# Patient Record
Sex: Female | Born: 1955
Health system: Southern US, Community
[De-identification: ages and names within clinical notes are randomized; demographics above are authoritative.]

## PROBLEM LIST (undated history)

## (undated) DIAGNOSIS — Z8619 Personal history of other infectious and parasitic diseases: Secondary | ICD-10-CM

## (undated) DIAGNOSIS — E039 Hypothyroidism, unspecified: Secondary | ICD-10-CM

## (undated) DIAGNOSIS — R011 Cardiac murmur, unspecified: Secondary | ICD-10-CM

## (undated) DIAGNOSIS — T7840XA Allergy, unspecified, initial encounter: Secondary | ICD-10-CM

## (undated) DIAGNOSIS — M8580 Other specified disorders of bone density and structure, unspecified site: Secondary | ICD-10-CM

## (undated) DIAGNOSIS — Z973 Presence of spectacles and contact lenses: Secondary | ICD-10-CM

## (undated) DIAGNOSIS — F429 Obsessive-compulsive disorder, unspecified: Secondary | ICD-10-CM

## (undated) DIAGNOSIS — N8110 Cystocele, unspecified: Secondary | ICD-10-CM

## (undated) DIAGNOSIS — Z972 Presence of dental prosthetic device (complete) (partial): Secondary | ICD-10-CM

## (undated) HISTORY — DX: Cardiac murmur, unspecified: R01.1

## (undated) HISTORY — DX: Other specified disorders of bone density and structure, unspecified site: M85.80

## (undated) HISTORY — DX: Personal history of other infectious and parasitic diseases: Z86.19

## (undated) HISTORY — PX: TUBAL LIGATION: SHX77

## (undated) HISTORY — DX: Allergy, unspecified, initial encounter: T78.40XA

## (undated) HISTORY — DX: Obsessive-compulsive disorder, unspecified: F42.9

## (undated) HISTORY — DX: Hypothyroidism, unspecified: E03.9

---

## 2000-05-17 HISTORY — PX: VAGINAL HYSTERECTOMY: SUR661

## 2000-05-17 HISTORY — PX: PARTIAL HYSTERECTOMY: SHX80

## 2001-03-17 HISTORY — PX: VAGINAL HYSTERECTOMY: SUR661

## 2005-05-17 LAB — HM COLONOSCOPY

## 2013-02-26 DIAGNOSIS — Z780 Asymptomatic menopausal state: Secondary | ICD-10-CM | POA: Insufficient documentation

## 2014-03-04 DIAGNOSIS — N9089 Other specified noninflammatory disorders of vulva and perineum: Secondary | ICD-10-CM | POA: Insufficient documentation

## 2016-01-23 ENCOUNTER — Ambulatory Visit (INDEPENDENT_AMBULATORY_CARE_PROVIDER_SITE_OTHER): Payer: PRIVATE HEALTH INSURANCE | Admitting: Women's Health

## 2016-01-23 ENCOUNTER — Encounter: Payer: Self-pay | Admitting: Women's Health

## 2016-01-23 VITALS — BP 130/78 | Ht 61.0 in | Wt 125.0 lb

## 2016-01-23 DIAGNOSIS — E038 Other specified hypothyroidism: Secondary | ICD-10-CM | POA: Diagnosis not present

## 2016-01-23 DIAGNOSIS — Z01419 Encounter for gynecological examination (general) (routine) without abnormal findings: Secondary | ICD-10-CM

## 2016-01-23 DIAGNOSIS — E039 Hypothyroidism, unspecified: Secondary | ICD-10-CM | POA: Insufficient documentation

## 2016-01-23 DIAGNOSIS — Z79890 Hormone replacement therapy (postmenopausal): Secondary | ICD-10-CM | POA: Diagnosis not present

## 2016-01-23 LAB — TSH: TSH: 0.54 mIU/L

## 2016-01-23 MED ORDER — THYROID 113.75 MG PO TABS: ORAL_TABLET | ORAL | 4 refills | Status: DC

## 2016-01-23 MED ORDER — PROGESTERONE MICRONIZED 100 MG PO CAPS: 300.0000 mg | ORAL_CAPSULE | Freq: Every day | ORAL | 4 refills | Status: DC

## 2016-01-23 MED ORDER — ESTRADIOL 0.1 MG/24HR TD PTTW: MEDICATED_PATCH | TRANSDERMAL | 4 refills | Status: DC

## 2016-01-23 NOTE — Progress Notes (Signed)
Christina Russo 01/07/1956 [id number]    History:    Presents for new patient annual exam. Postmenopausal on HRT, hysterectomy 2002 per patient. Reports normal Pap and mammogram history, last Pap 4-5 years ago, [facility].  [facility]. Up to date on colonoscopy screening. Endorses taking Prometrium and Estradiol, which has greatly improved menopausal symptoms. Takes Vit. D and Calcium supplement. Last sexually active in 2014, denies need for STD screen. Walks, does yoga, and some weight bearing exercise. Healthy diet. Reports normal CBC, lipid panel, blood sugar January 2017 at primary care.  Past medical history, past surgical history, family history and social history were all reviewed and documented in the EPIC chart. [place]. 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. 3 children, 8 grandchildren, all doing well.  ROS:  A ROS was performed and pertinent positives and negatives are included.  Exam:  Vitals:   01/23/16 0834  BP: 130/78  Weight: 125 lb (56.7 kg)  Height: 5\' 1"  (1.549 m)   Body mass index is 23.62 kg/m.   General appearance:  Normal Thyroid:  Symmetrical, normal in size, without palpable masses or nodularity. Respiratory  Auscultation:  Clear without wheezing or rhonchi Cardiovascular  Auscultation:  Regular rate, without rubs, murmurs or gallops  Edema/varicosities:  Not grossly evident Abdominal  Soft,nontender, without masses, guarding or rebound.  Liver/spleen:  No organomegaly noted  Hernia:  None appreciated  Skin  Inspection:  Grossly normal   Breasts: Examined lying and sitting.     Right: Without masses, retractions, discharge or axillary adenopathy.     Left: Without masses, retractions, discharge or axillary adenopathy. Gentitourinary   Inguinal/mons:  Normal without inguinal adenopathy  External genitalia:  Normal  BUS/Urethra/Skene's glands:  Normal  Vagina:   Normal. Mildly atrophic.  Cervix:  Absent  Uterus:  Absent  Adnexa/parametria:     Rt: Without masses or tenderness.   Lt: Without masses or tenderness.  Anus and perineum: Normal  Digital rectal exam: Normal sphincter tone without palpated masses or tenderness  Assessment/Plan:  60 y.o.  DWF G3P3 for annual exam without complaints.  Postmenopausal/Hysterectomy 2002 -- on HRT Hypothyroid    Plan: HRT, reviewed risks of blood clots, strokes and breast cancer requests to continue  Natural Prometrium 300 mg and Vivelle patch 0.1 mg half patch 3 times weekly prescriptions. Reviewed progesterone is to protect the uterus which she does not have desires to continue [employer] Nature Armor thyroid 113.75 mg PO daily, #90.  Encouraged to receive Zostavac . Encouraged to continue Calcium and Vit. D supplement. Discussed using condoms if becoming sexually active. Discussed SBEs, annual annual screening mammogram have results sent to our office, healthy diet, exercise, and fall prevention. Encouraged to continue leisure activities with friends and family. UA, TSHHarrington Challenger.    Hodge Stachnik J [facility], 9:28 AM 01/23/2016

## 2016-01-23 NOTE — Patient Instructions (Signed)

## 2016-01-24 LAB — URINALYSIS W MICROSCOPIC + REFLEX CULTURE
BACTERIA UA: NONE SEEN [HPF]
Bilirubin Urine: NEGATIVE
CASTS: NONE SEEN [LPF]
CRYSTALS: NONE SEEN [HPF]
GLUCOSE, UA: NEGATIVE
HGB URINE DIPSTICK: NEGATIVE
Ketones, ur: NEGATIVE
Leukocytes, UA: NEGATIVE
Nitrite: NEGATIVE
PH: 6 (ref 5.0–8.0)
Protein, ur: NEGATIVE
RBC / HPF: NONE SEEN RBC/HPF (ref <=2)
Specific Gravity, Urine: 1.022 (ref 1.001–1.035)
Squamous Epithelial / LPF: NONE SEEN [HPF] (ref <=5)
WBC, UA: NONE SEEN WBC/HPF (ref <=5)
YEAST: NONE SEEN [HPF]

## 2016-02-25 ENCOUNTER — Telehealth: Payer: Self-pay

## 2016-02-25 DIAGNOSIS — E038 Other specified hypothyroidism: Secondary | ICD-10-CM

## 2016-02-25 MED ORDER — THYROID 113.75 MG PO TABS: ORAL_TABLET | ORAL | 4 refills | Status: DC

## 2016-02-25 NOTE — Telephone Encounter (Signed)
Patient called him and is requesting a new fill there at [facility] her [facility] Thyroid. [facility]  Rx sent.

## 2016-03-30 ENCOUNTER — Telehealth: Payer: Self-pay

## 2016-03-30 DIAGNOSIS — E038 Other specified hypothyroidism: Secondary | ICD-10-CM

## 2016-03-30 MED ORDER — THYROID 60 MG PO TABS: 60.0000 mg | ORAL_TABLET | Freq: Every day | ORAL | 0 refills | Status: DC

## 2016-03-30 NOTE — Telephone Encounter (Signed)
Fax received stating Patient has a script for Merrill Lynchature Throid tab and currently this medication is long term out of stock and not available from the manufacturer. All Strengths of Westhroid, Nature-Throid and WP Throid products are currently out of stock. Pharmacy asked [place] to prescribed a new med. Said Armour Thyroid is available in limited quantities at this time.  [place] recommended Armour Thyroid 60 mg daily and recheck TSH in 6 weeks. I called patient and explained all of this in her voice mail. I asked her to call me back and let me know that she received my message and if any questions.

## 2016-03-30 NOTE — Telephone Encounter (Signed)
Patient called back and spoke with Debarah Crapelaudia and said she received my message and understood. Claudia scheduled her a lab appt in 6 weeks to recheck TSH. Order placed.

## 2016-05-05 ENCOUNTER — Other Ambulatory Visit: Payer: Self-pay

## 2016-05-05 MED ORDER — THYROID 60 MG PO TABS: 60.0000 mg | ORAL_TABLET | Freq: Every day | ORAL | 2 refills | Status: DC

## 2016-05-05 MED ORDER — THYROID 60 MG PO TABS: 60.0000 mg | ORAL_TABLET | Freq: Every day | ORAL | 0 refills | Status: DC

## 2016-05-17 HISTORY — PX: COLONOSCOPY: SHX174

## 2016-05-21 ENCOUNTER — Other Ambulatory Visit: Payer: PRIVATE HEALTH INSURANCE

## 2016-06-18 ENCOUNTER — Other Ambulatory Visit: Payer: PRIVATE HEALTH INSURANCE

## 2016-07-09 ENCOUNTER — Other Ambulatory Visit: Payer: PRIVATE HEALTH INSURANCE

## 2016-07-09 DIAGNOSIS — E038 Other specified hypothyroidism: Secondary | ICD-10-CM

## 2016-07-09 LAB — TSH: TSH: 1.66 m[IU]/L

## 2016-07-12 ENCOUNTER — Other Ambulatory Visit: Payer: Self-pay | Admitting: Women's Health

## 2016-07-12 MED ORDER — THYROID 60 MG PO TABS: 60.0000 mg | ORAL_TABLET | Freq: Every day | ORAL | 3 refills | Status: DC

## 2016-09-29 ENCOUNTER — Encounter: Payer: Self-pay | Admitting: Gynecology

## 2016-10-15 LAB — HM MAMMOGRAPHY: HM Mammogram: NORMAL (ref 0–4)

## 2017-01-28 ENCOUNTER — Encounter: Payer: PRIVATE HEALTH INSURANCE | Admitting: Women's Health

## 2017-01-31 ENCOUNTER — Ambulatory Visit (INDEPENDENT_AMBULATORY_CARE_PROVIDER_SITE_OTHER): Payer: PRIVATE HEALTH INSURANCE | Admitting: Women's Health

## 2017-01-31 ENCOUNTER — Encounter: Payer: Self-pay | Admitting: Women's Health

## 2017-01-31 VITALS — BP 124/78 | Ht 61.5 in | Wt 126.0 lb

## 2017-01-31 DIAGNOSIS — E038 Other specified hypothyroidism: Secondary | ICD-10-CM

## 2017-01-31 DIAGNOSIS — E559 Vitamin D deficiency, unspecified: Secondary | ICD-10-CM

## 2017-01-31 DIAGNOSIS — Z01419 Encounter for gynecological examination (general) (routine) without abnormal findings: Secondary | ICD-10-CM | POA: Diagnosis not present

## 2017-01-31 DIAGNOSIS — Z79890 Hormone replacement therapy (postmenopausal): Secondary | ICD-10-CM

## 2017-01-31 DIAGNOSIS — Z13220 Encounter for screening for lipoid disorders: Secondary | ICD-10-CM

## 2017-01-31 LAB — CBC WITH DIFFERENTIAL/PLATELET
BASOS ABS: 38 {cells}/uL (ref 0–200)
Basophils Relative: 0.6 %
EOS ABS: 166 {cells}/uL (ref 15–500)
Eosinophils Relative: 2.6 %
HEMATOCRIT: 40.2 % (ref 35.0–45.0)
Hemoglobin: 13.6 g/dL (ref 11.7–15.5)
LYMPHS ABS: 1734 {cells}/uL (ref 850–3900)
MCH: 32.1 pg (ref 27.0–33.0)
MCHC: 33.8 g/dL (ref 32.0–36.0)
MCV: 94.8 fL (ref 80.0–100.0)
MPV: 12.4 fL (ref 7.5–12.5)
Monocytes Relative: 9.6 %
Neutro Abs: 3846 cells/uL (ref 1500–7800)
Neutrophils Relative %: 60.1 %
Platelets: 248 10*3/uL (ref 140–400)
RBC: 4.24 10*6/uL (ref 3.80–5.10)
RDW: 12.3 % (ref 11.0–15.0)
TOTAL LYMPHOCYTE: 27.1 %
WBC: 6.4 10*3/uL (ref 3.8–10.8)
WBCMIX: 614 {cells}/uL (ref 200–950)

## 2017-01-31 LAB — COMPREHENSIVE METABOLIC PANEL
AG RATIO: 1.7 (calc) (ref 1.0–2.5)
ALBUMIN MSPROF: 4.3 g/dL (ref 3.6–5.1)
ALT: 13 U/L (ref 6–29)
AST: 15 U/L (ref 10–35)
Alkaline phosphatase (APISO): 61 U/L (ref 33–130)
BILIRUBIN TOTAL: 0.4 mg/dL (ref 0.2–1.2)
BUN: 15 mg/dL (ref 7–25)
CO2: 27 mmol/L (ref 20–32)
Calcium: 9 mg/dL (ref 8.6–10.4)
Chloride: 102 mmol/L (ref 98–110)
Creat: 0.67 mg/dL (ref 0.50–0.99)
GLUCOSE: 97 mg/dL (ref 65–99)
Globulin: 2.5 g/dL (calc) (ref 1.9–3.7)
POTASSIUM: 4.1 mmol/L (ref 3.5–5.3)
SODIUM: 137 mmol/L (ref 135–146)
TOTAL PROTEIN: 6.8 g/dL (ref 6.1–8.1)

## 2017-01-31 LAB — LIPID PANEL
Cholesterol: 178 mg/dL (ref <200)
HDL: 97 mg/dL (ref >50)
LDL CHOLESTEROL (CALC): 65 mg/dL
NON-HDL CHOLESTEROL (CALC): 81 mg/dL (ref <130)
TRIGLYCERIDES: 78 mg/dL (ref <150)
Total CHOL/HDL Ratio: 1.8 (calc) (ref <5.0)

## 2017-01-31 LAB — TSH: TSH: 1.08 mIU/L (ref 0.40–4.50)

## 2017-01-31 MED ORDER — PROGESTERONE MICRONIZED 100 MG PO CAPS: 300.0000 mg | ORAL_CAPSULE | Freq: Every day | ORAL | 4 refills | Status: DC

## 2017-01-31 MED ORDER — LEVOTHYROXINE SODIUM 88 MCG PO TABS: 88.0000 ug | ORAL_TABLET | Freq: Every day | ORAL | 90 refills | Status: DC

## 2017-01-31 MED ORDER — ESTRADIOL 0.1 MG/24HR TD PTTW: MEDICATED_PATCH | TRANSDERMAL | 4 refills | Status: DC

## 2017-01-31 NOTE — Patient Instructions (Addendum)
Dr Celine Ahr [facility]  [phone]  [facility]   Health Maintenance for Postmenopausal Women Menopause is a normal process in which your reproductive ability comes to an end. This process happens gradually over a span of months to years, usually between the ages of 19 and 3. Menopause is complete when you have missed 12 consecutive menstrual periods. It is important to talk with your health care provider about some of the most common conditions that affect postmenopausal women, such as heart disease, cancer, and bone loss (osteoporosis). Adopting a healthy lifestyle and getting preventive care can help to promote your health and wellness. Those actions can also lower your chances of developing some of these common conditions. What should I know about menopause? During menopause, you may experience a number of symptoms, such as:  Moderate-to-severe hot flashes.  Night sweats.  Decrease in sex drive.  Mood swings.  Headaches.  Tiredness.  Irritability.  Memory problems.  Insomnia.  Choosing to treat or not to treat menopausal changes is an individual decision that you make with your health care provider. What should I know about hormone replacement therapy and supplements? Hormone therapy products are effective for treating symptoms that are associated with menopause, such as hot flashes and night sweats. Hormone replacement carries certain risks, especially as you become older. If you are thinking about using estrogen or estrogen with progestin treatments, discuss the benefits and risks with your health care provider. What should I know about heart disease and stroke? Heart disease, heart attack, and stroke become more likely as you age. This may be due, in part, to the hormonal changes that your body experiences during menopause. These can affect how your body processes dietary fats, triglycerides, and cholesterol. Heart attack and stroke are both medical  emergencies. There are many things that you can do to help prevent heart disease and stroke:  Have your blood pressure checked at least every 1-2 years. High blood pressure causes heart disease and increases the risk of stroke.  If you are 60-79 years old, ask your health care provider if you should take aspirin to prevent a heart attack or a stroke.  Do not use any tobacco products, including cigarettes, chewing tobacco, or electronic cigarettes. If you need help quitting, ask your health care provider.  It is important to eat a healthy diet and maintain a healthy weight. ? Be sure to include plenty of vegetables, fruits, low-fat dairy products, and lean protein. ? Avoid eating foods that are high in solid fats, added sugars, or salt (sodium).  Get regular exercise. This is one of the most important things that you can do for your health. ? Try to exercise for at least 150 minutes each week. The type of exercise that you do should increase your heart rate and make you sweat. This is known as moderate-intensity exercise. ? Try to do strengthening exercises at least twice each week. Do these in addition to the moderate-intensity exercise.  Know your numbers.Ask your health care provider to check your cholesterol and your blood glucose. Continue to have your blood tested as directed by your health care provider.  What should I know about cancer screening? There are several types of cancer. Take the following steps to reduce your risk and to catch any cancer development as early as possible. Breast Cancer  Practice breast self-awareness. ? This means understanding how your breasts normally appear and feel. ? It also means doing regular breast self-exams. Let your health care  provider know about any changes, no matter how small.  If you are 12 or older, have a clinician do a breast exam (clinical breast exam or CBE) every year. Depending on your age, family history, and medical history, it  may be recommended that you also have a yearly breast X-ray (mammogram).  If you have a family history of breast cancer, talk with your health care provider about genetic screening.  If you are at high risk for breast cancer, talk with your health care provider about having an MRI and a mammogram every year.  Breast cancer (BRCA) gene test is recommended for women who have family members with BRCA-related cancers. Results of the assessment will determine the need for genetic counseling and BRCA1 and for BRCA2 testing. BRCA-related cancers include these types: ? Breast. This occurs in males or females. ? Ovarian. ? Tubal. This may also be called fallopian tube cancer. ? Cancer of the abdominal or pelvic lining (peritoneal cancer). ? Prostate. ? Pancreatic.  Cervical, Uterine, and Ovarian Cancer Your health care provider may recommend that you be screened regularly for cancer of the pelvic organs. These include your ovaries, uterus, and vagina. This screening involves a pelvic exam, which includes checking for microscopic changes to the surface of your cervix (Pap test).  For women ages 21-65, health care providers may recommend a pelvic exam and a Pap test every three years. For women ages 8-65, they may recommend the Pap test and pelvic exam, combined with testing for human papilloma virus (HPV), every five years. Some types of HPV increase your risk of cervical cancer. Testing for HPV may also be done on women of any age who have unclear Pap test results.  Other health care providers may not recommend any screening for nonpregnant women who are considered low risk for pelvic cancer and have no symptoms. Ask your health care provider if a screening pelvic exam is right for you.  If you have had past treatment for cervical cancer or a condition that could lead to cancer, you need Pap tests and screening for cancer for at least 20 years after your treatment. If Pap tests have been discontinued  for you, your risk factors (such as having a new sexual partner) need to be reassessed to determine if you should start having screenings again. Some women have medical problems that increase the chance of getting cervical cancer. In these cases, your health care provider may recommend that you have screening and Pap tests more often.  If you have a family history of uterine cancer or ovarian cancer, talk with your health care provider about genetic screening.  If you have vaginal bleeding after reaching menopause, tell your health care provider.  There are currently no reliable tests available to screen for ovarian cancer.  Lung Cancer Lung cancer screening is recommended for adults 76-42 years old who are at high risk for lung cancer because of a history of smoking. A yearly low-dose CT scan of the lungs is recommended if you:  Currently smoke.  Have a history of at least 30 pack-years of smoking and you currently smoke or have quit within the past 15 years. A pack-year is smoking an average of one pack of cigarettes per day for one year.  Yearly screening should:  Continue until it has been 15 years since you quit.  Stop if you develop a health problem that would prevent you from having lung cancer treatment.  Colorectal Cancer  This type of cancer can be  detected and can often be prevented.  Routine colorectal cancer screening usually begins at age 31 and continues through age 32.  If you have risk factors for colon cancer, your health care provider may recommend that you be screened at an earlier age.  If you have a family history of colorectal cancer, talk with your health care provider about genetic screening.  Your health care provider may also recommend using home test kits to check for hidden blood in your stool.  A small camera at the end of a tube can be used to examine your colon directly (sigmoidoscopy or colonoscopy). This is done to check for the earliest forms of  colorectal cancer.  Direct examination of the colon should be repeated every 5-10 years until age 13. However, if early forms of precancerous polyps or small growths are found or if you have a family history or genetic risk for colorectal cancer, you may need to be screened more often.  Skin Cancer  Check your skin from head to toe regularly.  Monitor any moles. Be sure to tell your health care provider: ? About any new moles or changes in moles, especially if there is a change in a mole's shape or color. ? If you have a mole that is larger than the size of a pencil eraser.  If any of your family members has a history of skin cancer, especially at a [place] age, talk with your health care provider about genetic screening.  Always use sunscreen. Apply sunscreen liberally and repeatedly throughout the day.  Whenever you are outside, protect yourself by wearing long sleeves, pants, a wide-brimmed hat, and sunglasses.  What should I know about osteoporosis? Osteoporosis is a condition in which bone destruction happens more quickly than new bone creation. After menopause, you may be at an increased risk for osteoporosis. To help prevent osteoporosis or the bone fractures that can happen because of osteoporosis, the following is recommended:  If you are 48-24 years old, get at least 1,000 mg of calcium and at least 600 mg of vitamin D per day.  If you are older than age 43 but younger than age 71, get at least 1,200 mg of calcium and at least 600 mg of vitamin D per day.  If you are older than age 51, get at least 1,200 mg of calcium and at least 800 mg of vitamin D per day.  Smoking and excessive alcohol intake increase the risk of osteoporosis. Eat foods that are rich in calcium and vitamin D, and do weight-bearing exercises several times each week as directed by your health care provider. What should I know about how menopause affects my mental health? Depression may occur at any age, but it  is more common as you become older. Common symptoms of depression include:  Low or sad mood.  Changes in sleep patterns.  Changes in appetite or eating patterns.  Feeling an overall lack of motivation or enjoyment of activities that you previously enjoyed.  Frequent crying spells.  Talk with your health care provider if you think that you are experiencing depression. What should I know about immunizations? It is important that you get and maintain your immunizations. These include:  Tetanus, diphtheria, and pertussis (Tdap) booster vaccine.  Influenza every year before the flu season begins.  Pneumonia vaccine.  Shingles vaccine.  Your health care provider may also recommend other immunizations. This information is not intended to replace advice given to you by your health care provider. Make sure  you discuss any questions you have with your health care provider. Document Released: 06/25/2005 Document Revised: 11/21/2015 Document Reviewed: 02/04/2015 Elsevier Interactive Patient Education  2018 [employer].

## 2017-01-31 NOTE — Progress Notes (Addendum)
Christina Russo 02-05-1956 [id number]    History:    Presents for annual exam.  2002 TVH for fibroids on HRT was started on progesterone and estrogen per primary care. Continues to have hot flushes. 2017 T score -2.6 declines medication. Negative colonoscopy greater than 10 years ago. Healthy lifestyle of yoga, weight 7 walking. Not sexually active in years. Normal Pap and mammogram history. Hypothyroid on Armour thyroid, complained of cost.  Past medical history, past surgical history, family history and social history were all reviewed and documented in the EPIC chart. 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  ROS:  A ROS was performed and pertinent positives and negatives are included.  Exam:  Vitals:   01/31/17 0800  BP: 124/78  Weight: 126 lb (57.2 kg)  Height: 5' 1.5" (1.562 m)   Body mass index is 23.42 kg/m.   General appearance:  Normal Thyroid:  Symmetrical, normal in size, without palpable masses or nodularity. Respiratory  Auscultation:  Clear without wheezing or rhonchi Cardiovascular  Auscultation:  Regular rate, without rubs, murmurs or gallops  Edema/varicosities:  Not grossly evident Abdominal  Soft,nontender, without masses, guarding or rebound.  Liver/spleen:  No organomegaly noted  Hernia:  None appreciated  Skin  Inspection:  Grossly normal   Breasts: Examined lying and sitting.     Right: Without masses, retractions, discharge or axillary adenopathy.     Left: Without masses, retractions, discharge or axillary adenopathy. Gentitourinary   Inguinal/mons:  Normal without inguinal adenopathy  External genitalia:  Normal  BUS/Urethra/Skene's glands:  Normal  Vagina:  Normal  Cervix:  Absent  Uterus: Absent  Adnexa/parametria:     Rt: Without masses or tenderness.   Lt: Without masses or tenderness.  Anus and perineum: Normal  Digital rectal exam: Normal sphincter tone without palpated masses or tenderness  Assessment/Plan:  61 y.o. D WF G3 P3  for annual exam with continued hot flushes on HRT.  02 TVH on HRT Hypothyroid Osteoporosis declines medication  Plan: Currently on Armour thyroid will try Synthroid 88 g, recheck TSH in 6 weeks. Proper administration reviewed. The Vivelle patch 0.1 mg twice weekly, Prometrium 100 mg daily, prescriptions for both given with review of risks of blood clots, strokes and breast cancer. Reviewed Prometrium is not needed, hysterectomy. States has done well on both would like to continue. SBE's, continue annual screening mammogram, calcium rich diet, vitamin D 2000 daily encouraged. Continue healthy lifestyle of yoga, walking and weight training. Overdue for screening Colonoscopy, [facility] information given instructed to schedule. Osteoporosis treatment reviewed, declines. CBC, CMP, lipid panel, TSH, vitamin D.     Harrington Challenger [facility], 8:41 AM 01/31/2017

## 2017-02-01 LAB — VITAMIN D 25 HYDROXY (VIT D DEFICIENCY, FRACTURES): Vit D, 25-Hydroxy: 30 ng/mL (ref 30–100)

## 2017-02-04 ENCOUNTER — Encounter: Payer: Self-pay | Admitting: Internal Medicine

## 2017-03-09 ENCOUNTER — Encounter: Payer: Self-pay | Admitting: Family

## 2017-03-09 ENCOUNTER — Ambulatory Visit (INDEPENDENT_AMBULATORY_CARE_PROVIDER_SITE_OTHER): Payer: No Typology Code available for payment source | Admitting: Family

## 2017-03-09 VITALS — BP 131/73 | HR 67 | Temp 98.1°F | Resp 16 | Ht 61.5 in | Wt 127.0 lb

## 2017-03-09 DIAGNOSIS — Z0000 Encounter for general adult medical examination without abnormal findings: Secondary | ICD-10-CM | POA: Diagnosis not present

## 2017-03-09 DIAGNOSIS — F429 Obsessive-compulsive disorder, unspecified: Secondary | ICD-10-CM

## 2017-03-09 HISTORY — DX: Obsessive-compulsive disorder, unspecified: F42.9

## 2017-03-09 MED ORDER — NONFORMULARY OR COMPOUNDED ITEM: 1.0000 | Freq: Every day | 3 refills | Status: DC

## 2017-03-09 NOTE — Patient Instructions (Signed)
Continue healthy diet and regular exercise.   Welcome to [place]! 

## 2017-03-09 NOTE — Progress Notes (Signed)
Subjective:    Patient ID: Christina Russo, female    DOB: 19-Jun-1955, 61 y.o.   MRN: [id number]  HPI  Christina Russo is a 61 yr old female who presents today to establish care.   pmhx is significant for hypothyroid.    Patient presents today for complete physical.  Immunizations: tetanus 2014, flu shot 03/04/17 Diet: healthy Exercise: yoga, cardio Colonoscopy:  Scheduled, last col in 2006 Dexa:  2016 Pap Smear: hysterectomy Mammogram: 10/2016 Dental: up to date Vision: contacts, exam up to date  OCD- reports that this is well controlled on the following:  Methylcobalamin 1mg  and methylfolate10mg  med solution 1 cap daily   Review of Systems  Constitutional: Negative for unexpected weight change.  HENT: Negative for hearing loss and rhinorrhea.   Eyes: Negative for visual disturbance.  Respiratory: Negative for cough and shortness of breath.   Cardiovascular: Negative for leg swelling.  Gastrointestinal: Negative for blood in stool, constipation and diarrhea.  Genitourinary: Negative for dysuria, frequency and hematuria.  Musculoskeletal: Negative for arthralgias and myalgias.  Skin:       Mild eczema on her leg, worse in the winter  Hematological: Negative for adenopathy.  Psychiatric/Behavioral:       Denies depression/anxiety   Past Medical History:  Diagnosis Date  . Heart murmur    "slight / intermittent"  . History of chicken pox   . Hypothyroidism      Social History   Social History  . Marital status: Divorced    Spouse name: N/A  . Number of children: N/A  . Years of education: N/A   Occupational History  . Not on file.   Social History Main Topics  . Smoking status: Former Games developer  . Smokeless tobacco: Never Used  . Alcohol use 4.2 oz/week    7 Glasses of wine per week  . Drug use: No  . Sexual activity: Not Currently     Comment: INTERCOURSE AGE 84, SEXUAL PARTNERS LESS THAN 5   Other Topics Concern  . Not on file   Social History Narrative    . No narrative on file    Past Surgical History:  Procedure Laterality Date  . PARTIAL HYSTERECTOMY  2002    Family History  Problem Relation Age of Onset  . Diabetes Mother   . Hypertension Mother   . Diabetes Father   . Congestive Heart Failure Father   . Hypertension Father   . Heart attack Father   . Glaucoma Sister   . Blindness Sister   . Heart attack Brother   . Stroke Maternal Grandmother   . Blindness Sister   . Blindness Brother   . Heart disease Brother   . Drug abuse Son   . Blindness Brother     Allergies  Allergen Reactions  . Codeine Nausea And Vomiting  . Penicillins     Current Outpatient Prescriptions on File Prior to Visit  Medication Sig Dispense Refill  . CALCIUM-VITAMIN D PO Take 1 tablet by mouth daily.    . Cholecalciferol (VITAMIN D3) 1000 units CAPS Take by mouth.    Tery Sanfilippo Calcium (STOOL SOFTENER PO) Take by mouth 3 times/day as needed-between meals & bedtime.    Marland Kitchen estradiol (VIVELLE-DOT) 0.1 MG/24HR patch 1/2 PATCH 3 TIMES WEEKLY 24 patch 4  . levothyroxine (SYNTHROID) 88 MCG tablet Take 1 tablet (88 mcg total) by mouth daily before breakfast. 90 tablet 90  . Melatonin 3 MG TABS Take 2 tablets by mouth daily.    Marland Kitchen  METHYLCOBALAMIN PO Take by mouth. 1 MG DAILY    . NONFORMULARY OR COMPOUNDED ITEM VALERIAN SLEEP MED PRN    . NONFORMULARY OR COMPOUNDED ITEM CASCARA SAGRADA 1/2 TAB PO QD    . progesterone (PROMETRIUM) 100 MG capsule Take 3 capsules (300 mg total) by mouth daily. 270 capsule 4   No current facility-administered medications on file prior to visit.     BP 131/73 (BP Location: Right Arm, Cuff Size: Normal)   Pulse 67   Temp 98.1 F (36.7 C) (Oral)   Resp 16   Ht 5' 1.5" (1.562 m)   Wt 127 lb (57.6 kg)   SpO2 100%   BMI 23.61 kg/m       Objective:   Physical Exam  Physical Exam  Constitutional: She is oriented to person, place, and time. She appears well-developed and well-nourished. No distress.  HENT:   Head: Normocephalic and atraumatic.  Right Ear: Tympanic membrane and ear canal normal.  Left Ear: Tympanic membrane and ear canal normal.  Mouth/Throat: Oropharynx is clear and moist.  Eyes: Pupils are equal, round, and reactive to light. No scleral icterus.  Neck: Normal range of motion. No thyromegaly present.  Cardiovascular: Normal rate and regular rhythm.   No murmur heard. Pulmonary/Chest: Effort normal and breath sounds normal. No respiratory distress. He has no wheezes. She has no rales. She exhibits no tenderness.  Abdominal: Soft. Bowel sounds are normal. She exhibits no distension and no mass. There is no tenderness. There is no rebound and no guarding.  Musculoskeletal: She exhibits no edema.  Lymphadenopathy:    She has no cervical adenopathy.  Neurological: She is alert and oriented to person, place, and time. She has normal patellar reflexes. She exhibits normal muscle tone. Coordination normal.  Skin: Skin is warm and dry.  Psychiatric: She has a normal mood and affect. Her behavior is normal. Judgment and thought content normal.  Breasts: deferred Pelvic: deferred          Assessment & Plan:         Assessment & Plan:  Preventative care- encouraged pt to continue healthy diet, regular exercise. She has rx from her GYN for the shingrix rx.  Tetanus/flu up to date. mammo  Up to date, colo is due and is scheduled. EKG tracing is personally reviewed.  EKG notes NSR.  No acute changes.

## 2017-04-01 ENCOUNTER — Ambulatory Visit (AMBULATORY_SURGERY_CENTER): Payer: Self-pay

## 2017-04-01 ENCOUNTER — Other Ambulatory Visit: Payer: Self-pay

## 2017-04-01 VITALS — Ht 61.0 in | Wt 126.6 lb

## 2017-04-01 DIAGNOSIS — Z1211 Encounter for screening for malignant neoplasm of colon: Secondary | ICD-10-CM

## 2017-04-01 NOTE — Progress Notes (Signed)
Denies allergies to eggs or soy products. Denies complication of anesthesia or sedation. Denies use of weight loss medication. Denies use of O2.   Emmi instructions declined.  

## 2017-04-15 ENCOUNTER — Ambulatory Visit (AMBULATORY_SURGERY_CENTER): Payer: No Typology Code available for payment source | Admitting: Internal Medicine

## 2017-04-15 ENCOUNTER — Other Ambulatory Visit: Payer: Self-pay

## 2017-04-15 ENCOUNTER — Encounter: Payer: Self-pay | Admitting: Internal Medicine

## 2017-04-15 VITALS — BP 133/72 | HR 68 | Temp 98.4°F | Resp 17 | Ht 61.0 in | Wt 126.0 lb

## 2017-04-15 DIAGNOSIS — Z1212 Encounter for screening for malignant neoplasm of rectum: Secondary | ICD-10-CM

## 2017-04-15 DIAGNOSIS — Z1211 Encounter for screening for malignant neoplasm of colon: Secondary | ICD-10-CM | POA: Diagnosis not present

## 2017-04-15 MED ORDER — SODIUM CHLORIDE 0.9 % IV SOLN: 500.0000 mL | INTRAVENOUS | Status: DC

## 2017-04-15 NOTE — Patient Instructions (Addendum)
No polyps or cancer seen. You do have a change in the colon lining called melanosis - usually from senna in laxatives or herbal products.  Next routine colonoscopy or other screening test in 10 years - 2028  I appreciate the opportunity to care for you. Christina Booparl E. Gessner, MD, FACG  YOU HAD AN ENDOSCOPIC PROCEDURE TODAY AT THE [facility]:   Refer to the procedure report that was given to you for any specific questions about what was found during the examination.  If the procedure report does not answer your questions, please call your gastroenterologist to clarify.  If you requested that your care partner not be given the details of your procedure findings, then the procedure report has been included in a sealed envelope for you to review at your convenience later.  YOU SHOULD EXPECT: Some feelings of bloating in the abdomen. Passage of more gas than usual.  Walking can help get rid of the air that was put into your GI tract during the procedure and reduce the bloating. If you had a lower endoscopy (such as a colonoscopy or flexible sigmoidoscopy) you may notice spotting of blood in your stool or on the toilet paper. If you underwent a bowel prep for your procedure, you may not have a normal bowel movement for a few days.  Please Note:  You might notice some irritation and congestion in your nose or some drainage.  This is from the oxygen used during your procedure.  There is no need for concern and it should clear up in a day or so.  SYMPTOMS TO REPORT IMMEDIATELY:   Following lower endoscopy (colonoscopy or flexible sigmoidoscopy):  Excessive amounts of blood in the stool  Significant tenderness or worsening of abdominal pains  Swelling of the abdomen that is new, acute  Fever of 100F or higher   Following upper endoscopy (EGD)  Vomiting of blood or coffee ground material  New chest pain or pain under the shoulder blades  Painful or persistently difficult  swallowing  New shortness of breath  Fever of 100F or higher  Black, tarry-looking stools  For urgent or emergent issues, a gastroenterologist can be reached at any hour by calling [phone].   DIET:  We do recommend a small meal at first, but then you may proceed to your regular diet.  Drink plenty of fluids but you should avoid alcoholic beverages for 24 hours.  ACTIVITY:  You should plan to take it easy for the rest of today and you should NOT DRIVE or use heavy machinery until tomorrow (because of the sedation medicines used during the test).    FOLLOW UP: Our staff will call the number listed on your records the next business day following your procedure to check on you and address any questions or concerns that you may have regarding the information given to you following your procedure. If we do not reach you, we will leave a message.  However, if you are feeling well and you are not experiencing any problems, there is no need to return our call.  We will assume that you have returned to your regular daily activities without incident.  If any biopsies were taken you will be contacted by phone or by letter within the next 1-3 weeks.  Please call us at [phone] if you have not heard about the biopsies in 3 weeks.    SIGNATURES/CONFIDENTIALITY: You and/or your care partner have signed paperwork which will be entered into your  electronic medical record.  These signatures attest to the fact that that the information above on your After Visit Summary has been reviewed and is understood.  Full responsibility of the confidentiality of this discharge information lies with you and/or your care-partner.

## 2017-04-15 NOTE — Progress Notes (Signed)
To recovery, report to RN, VSS. 

## 2017-04-15 NOTE — Progress Notes (Signed)
Pt's states no medical or surgical changes since previsit or office visit. 

## 2017-04-15 NOTE — Op Note (Signed)
[facility] Patient Name: Christina BurrowJudy Russo Procedure Date: 04/15/2017 7:51 AM MRN: [id number] Endoscopist: Iva Booparl E Jubal Rademaker , MD Age: [age] Referring MD:  Date of Birth: 07/10/1955 Gender: Female Account #: [id number] Procedure:                Colonoscopy Indications:              Screening for colorectal malignant neoplasm Medicines:                Propofol per Anesthesia, Monitored Anesthesia Care Procedure:                Pre-Anesthesia Assessment:                           - Prior to the procedure, a History and Physical                            was performed, and patient medications and                            allergies were reviewed. The patient's tolerance of                            previous anesthesia was also reviewed. The risks                            and benefits of the procedure and the sedation                            options and risks were discussed with the patient.                            All questions were answered, and informed consent                            was obtained. Prior Anticoagulants: The patient has                            taken no previous anticoagulant or antiplatelet                            agents. ASA Grade Assessment: II - A patient with                            mild systemic disease. After reviewing the risks                            and benefits, the patient was deemed in                            satisfactory condition to undergo the procedure.                           After obtaining informed consent, the colonoscope  was passed under direct vision. Throughout the                            procedure, the patient's blood pressure, pulse, and                            oxygen saturations were monitored continuously. The                            Model PCF-[id number] scope was introduced                            through the anus and advanced to the the terminal           ileum, with identification of the appendiceal                            orifice and IC valve. The colonoscopy was performed                            without difficulty. The patient tolerated the                            procedure well. The quality of the bowel                            preparation was excellent. The ileocecal valve,                            appendiceal orifice, and rectum were photographed.                            The bowel preparation used was Miralax. Scope In: 8:00:36 AM Scope Out: 8:12:40 AM Scope Withdrawal Time: 0 hours 8 minutes 56 seconds  Total Procedure Duration: 0 hours 12 minutes 4 seconds  Findings:                 The perianal and digital rectal examinations were                            normal.                           A diffuse area of severe melanosis was found in the                            entire colon.                           The exam was otherwise without abnormality on                            direct and retroflexion views. Complications:            No immediate complications. Estimated Blood Loss:     Estimated blood loss: none. Impression:               -  Melanosis in the colon.                           - The examination was otherwise normal on direct                            and retroflexion views.                           - No specimens collected. Recommendation:           - Patient has a contact number available for                            emergencies. The signs and symptoms of potential                            delayed complications were discussed with the                            patient. Return to normal activities tomorrow.                            Written discharge instructions were provided to the                            patient.                           - Resume previous diet.                           - Continue present medications.                           - Repeat colonoscopy for  screening purposes. Iva Booparl E Jamar Weatherall, MD 04/15/2017 8:20:15 AM This report has been signed electronically.

## 2017-04-18 ENCOUNTER — Telehealth: Payer: Self-pay

## 2017-04-18 ENCOUNTER — Telehealth: Payer: Self-pay | Admitting: *Deleted

## 2017-04-18 NOTE — Telephone Encounter (Signed)
Number identifier, left a message. 

## 2017-04-18 NOTE — Telephone Encounter (Signed)
  Follow up Call-  Call back number 04/15/2017  Post procedure Call Back phone  # [phone]  Permission to leave phone message Yes  Some recent data might be hidden     Patient questions:  Do you have a fever, pain , or abdominal swelling? No. Pain Score  0 *  Have you tolerated food without any problems? Yes.    Have you been able to return to your normal activities? Yes.    Do you have any questions about your discharge instructions: Diet   No. Medications  No. Follow up visit  No.  Do you have questions or concerns about your Care? No.  Actions: * If pain score is 4 or above: No action needed, pain <4.

## 2017-10-17 ENCOUNTER — Encounter: Payer: Self-pay | Admitting: Women's Health

## 2017-10-17 ENCOUNTER — Ambulatory Visit (INDEPENDENT_AMBULATORY_CARE_PROVIDER_SITE_OTHER): Payer: PRIVATE HEALTH INSURANCE | Admitting: Women's Health

## 2017-10-17 VITALS — BP 128/80

## 2017-10-17 DIAGNOSIS — N816 Rectocele: Secondary | ICD-10-CM

## 2017-10-17 NOTE — Patient Instructions (Signed)

## 2017-10-17 NOTE — Progress Notes (Signed)
HPI: [age] y.r. DWF G3P3 presents with intermittent vaginal "prolapse" and pressure in her vagina for 3 months. It is worse with coughing, bearing down, and while on her evening brisk walks. The pressure is relieved when she pushes the prolapse upwards. Overall she rates the discomfort a 5 on a scale of 1 to 10. 2002 TVH for fibroids on Vivelle .5 three times weekly and Prometrium 100 mg daily.  Medical problems include hypothyroidism.  Not sexually active.  Denies vaginal discharge, urinary symptoms, abdominal pain.  Past medical history, past surgical history, family history and social history were all reviewed and documented in the EPIC chart. 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 About to start a new job [employer] and will be in between healthcare insurance soon. Denies any recent sexual activity.   Exam:  General appearance: Normal, alert and oriented, well-developed and well-nourished, normal weight  Gentitourinary              External genitalia:  Normal             BUS/Urethra/Skene's glands:  Normal             Vagina: +3 rectocele with slight bearing down             Cervix:  Absent             Uterus: Absent                           Assessment/Plan:   Rectocele prolapse 02 TVH on HRT  1) Educated the patient on using lubricated tampons when she goes on walks to alleviate the pressure from the rectocele prolapse. 2) instructed to schedule surgical consult with Dr. Seymour BarsLavoie after she starts her new job.

## 2018-01-22 ENCOUNTER — Other Ambulatory Visit: Payer: Self-pay | Admitting: Women's Health

## 2018-01-22 DIAGNOSIS — Z79890 Hormone replacement therapy (postmenopausal): Secondary | ICD-10-CM

## 2018-01-23 NOTE — Telephone Encounter (Signed)
CE is scheduled 02/05/18.

## 2018-02-01 ENCOUNTER — Encounter: Payer: PRIVATE HEALTH INSURANCE | Admitting: Women's Health

## 2018-03-07 ENCOUNTER — Telehealth: Payer: Self-pay | Admitting: *Deleted

## 2018-03-07 MED ORDER — NONFORMULARY OR COMPOUNDED ITEM: 1.0000 | Freq: Every day | 0 refills | Status: DC

## 2018-03-07 NOTE — Telephone Encounter (Signed)
Received fax from [facility] for methylcobalamin 1mg  and MethylFolate 10mg  Capsules. Refill faxed to the pharmacy. Pt last seen 03/09/17 and due for annual wellness exam.

## 2018-03-13 ENCOUNTER — Encounter: Payer: No Typology Code available for payment source | Admitting: Family

## 2018-03-31 ENCOUNTER — Encounter: Payer: Self-pay | Admitting: Family

## 2018-03-31 ENCOUNTER — Ambulatory Visit (INDEPENDENT_AMBULATORY_CARE_PROVIDER_SITE_OTHER): Payer: No Typology Code available for payment source | Admitting: Family

## 2018-03-31 VITALS — BP 124/80 | HR 75 | Temp 98.4°F | Resp 16 | Ht 61.0 in | Wt 129.0 lb

## 2018-03-31 DIAGNOSIS — Z0000 Encounter for general adult medical examination without abnormal findings: Secondary | ICD-10-CM

## 2018-03-31 DIAGNOSIS — E559 Vitamin D deficiency, unspecified: Secondary | ICD-10-CM

## 2018-03-31 DIAGNOSIS — Z23 Encounter for immunization: Secondary | ICD-10-CM | POA: Diagnosis not present

## 2018-03-31 DIAGNOSIS — M810 Age-related osteoporosis without current pathological fracture: Secondary | ICD-10-CM

## 2018-03-31 LAB — HEPATIC FUNCTION PANEL
ALBUMIN: 4.3 g/dL (ref 3.5–5.2)
ALT: 12 U/L (ref 0–35)
AST: 14 U/L (ref 0–37)
Alkaline Phosphatase: 53 U/L (ref 39–117)
Bilirubin, Direct: 0.1 mg/dL (ref 0.0–0.3)
Total Bilirubin: 0.5 mg/dL (ref 0.2–1.2)
Total Protein: 6.7 g/dL (ref 6.0–8.3)

## 2018-03-31 LAB — CBC WITH DIFFERENTIAL/PLATELET
Basophils Absolute: 0.1 10*3/uL (ref 0.0–0.1)
Basophils Relative: 1 % (ref 0.0–3.0)
Eosinophils Absolute: 0.2 10*3/uL (ref 0.0–0.7)
Eosinophils Relative: 2.9 % (ref 0.0–5.0)
HCT: 43.8 % (ref 36.0–46.0)
Hemoglobin: 14.5 g/dL (ref 12.0–15.0)
LYMPHS ABS: 1.8 10*3/uL (ref 0.7–4.0)
LYMPHS PCT: 22.4 % (ref 12.0–46.0)
MCHC: 33.2 g/dL (ref 30.0–36.0)
MCV: 97.9 fl (ref 78.0–100.0)
MONOS PCT: 8.7 % (ref 3.0–12.0)
Monocytes Absolute: 0.7 10*3/uL (ref 0.1–1.0)
NEUTROS PCT: 65 % (ref 43.0–77.0)
Neutro Abs: 5.2 10*3/uL (ref 1.4–7.7)
Platelets: 207 10*3/uL (ref 150.0–400.0)
RBC: 4.47 Mil/uL (ref 3.87–5.11)
RDW: 13.7 % (ref 11.5–15.5)
WBC: 8 10*3/uL (ref 4.0–10.5)

## 2018-03-31 LAB — URINALYSIS, ROUTINE W REFLEX MICROSCOPIC
Bilirubin Urine: NEGATIVE
Hgb urine dipstick: NEGATIVE
KETONES UR: NEGATIVE
LEUKOCYTES UA: NEGATIVE
NITRITE: NEGATIVE
PH: 7 (ref 5.0–8.0)
SPECIFIC GRAVITY, URINE: 1.01 (ref 1.000–1.030)
TOTAL PROTEIN, URINE-UPE24: NEGATIVE
Urine Glucose: NEGATIVE
Urobilinogen, UA: 0.2 (ref 0.0–1.0)

## 2018-03-31 LAB — BASIC METABOLIC PANEL
BUN: 13 mg/dL (ref 6–23)
CALCIUM: 9.2 mg/dL (ref 8.4–10.5)
CO2: 26 mEq/L (ref 19–32)
CREATININE: 0.82 mg/dL (ref 0.40–1.20)
Chloride: 104 mEq/L (ref 96–112)
GFR: 74.45 mL/min (ref >60.00)
Glucose, Bld: 84 mg/dL (ref 70–99)
Potassium: 4.1 mEq/L (ref 3.5–5.1)
SODIUM: 139 meq/L (ref 135–145)

## 2018-03-31 LAB — LIPID PANEL
CHOLESTEROL: 164 mg/dL (ref 0–200)
HDL: 88.4 mg/dL (ref >39.00)
LDL Cholesterol: 63 mg/dL (ref 0–99)
NonHDL: 75.55
Total CHOL/HDL Ratio: 2
Triglycerides: 63 mg/dL (ref 0.0–149.0)
VLDL: 12.6 mg/dL (ref 0.0–40.0)

## 2018-03-31 LAB — TSH: TSH: 1.07 u[IU]/mL (ref 0.35–4.50)

## 2018-03-31 MED ORDER — CALCIUM CARBONATE-VITAMIN D 600-400 MG-UNIT PO CHEW: 1.0000 | CHEWABLE_TABLET | Freq: Two times a day (BID) | ORAL | Status: AC

## 2018-03-31 NOTE — Progress Notes (Signed)
Subjective:    Patient ID: Christina Russo, female    DOB: 01-Feb-1956, 62 y.o.   MRN: [id number]  HPI  Patient presents today for complete physical.  Immunizations: tdap 2014, flu shot up to date  Diet:  healthy Exercise:  yoga Colonoscopy: 04/15/17 Dexa: reports her last one was at [facility].  Pap Smear: hysterectomy Mammogram: 10/19/16 Vision:  due Dental: up to date Wt Readings from Last 3 Encounters:  03/31/18 129 lb (58.5 kg)  04/15/17 126 lb (57.2 kg)  04/01/17 126 lb 9.6 oz (57.4 kg)      Review of Systems  Constitutional: Negative for unexpected weight change.  HENT: Negative for hearing loss and rhinorrhea.   Eyes: Negative for visual disturbance.  Respiratory: Negative for cough.   Cardiovascular: Negative for leg swelling.  Gastrointestinal: Negative for blood in stool, constipation and diarrhea.  Genitourinary: Negative for dysuria and frequency.  Musculoskeletal: Negative for arthralgias and myalgias.  Skin: Negative for rash.  Neurological: Negative for headaches.  Hematological: Negative for adenopathy.  Psychiatric/Behavioral:       Denies depression/anxiety   Past Medical History:  Diagnosis Date  . Heart murmur    "slight / intermittent"  . History of chicken pox   . Hypothyroidism   . OCD (obsessive compulsive disorder) 03/09/2017   Patient denies  . Osteopenia      Social History   Socioeconomic History  . Marital status: Divorced    Spouse name: Not on file  . Number of children: Not on file  . Years of education: Not on file  . Highest education level: Not on file  Occupational History  . Not on file  Social Needs  . Financial resource strain: Not on file  . Food insecurity:    Worry: Not on file    Inability: Not on file  . Transportation needs:    Medical: Not on file    Non-medical: Not on file  Tobacco Use  . Smoking status: Former Games developer  . Smokeless tobacco: Never Used  Substance and Sexual Activity  . Alcohol use:  Yes    Alcohol/week: 7.0 standard drinks    Types: 7 Glasses of wine per week  . Drug use: No  . Sexual activity: Not Currently    Comment: INTERCOURSE AGE 65, SEXUAL PARTNERS LESS THAN 5  Lifestyle  . Physical activity:    Days per week: Not on file    Minutes per session: Not on file  . Stress: Not on file  Relationships  . Social connections:    Talks on phone: Not on file    Gets together: Not on file    Attends religious service: Not on file    Active member of club or organization: Not on file    Attends meetings of clubs or organizations: Not on file    Relationship status: Not on file  . Intimate partner violence:    Fear of current or ex partner: Not on file    Emotionally abused: Not on file    Physically abused: Not on file    Forced sexual activity: Not on file  Other Topics Concern  . Not on file  Social History Narrative   Works in [employer] at [employer]   Divorced   3 children and 9 grandchildren and 1 great grandchild   Her children live locally   Enjoys reading, cooking, gardening, dance, music    Past Surgical History:  Procedure Laterality Date  . PARTIAL HYSTERECTOMY  2002  Family History  Problem Relation Age of Onset  . Diabetes Mother   . Hypertension Mother   . CVA Mother   . Diabetes Father   . Congestive Heart Failure Father   . Hypertension Father   . Heart attack Father   . Glaucoma Sister   . Blindness Sister   . Heart attack Brother   . Stroke Maternal Grandmother   . Blindness Sister   . Blindness Brother   . Heart disease Brother   . Drug abuse Son   . Blindness Brother   . Colon cancer Neg Hx   . Esophageal cancer Neg Hx   . Pancreatic cancer Neg Hx   . Rectal cancer Neg Hx   . Stomach cancer Neg Hx     Allergies  Allergen Reactions  . Codeine Nausea And Vomiting  . Penicillins     Current Outpatient Medications on File Prior to Visit  Medication Sig Dispense Refill  . Cholecalciferol (VITAMIN D3) 1000  units CAPS Take by mouth.    Tery Sanfilippo. Docusate Calcium (STOOL SOFTENER PO) Take by mouth 3 times/day as needed-between meals & bedtime.    Marland Kitchen. estradiol (VIVELLE-DOT) 0.1 MG/24HR patch APPLY 1/2 PATCH 3 TIMES  WEEKLY 24 patch 0  . levothyroxine (SYNTHROID) 88 MCG tablet Take 1 tablet (88 mcg total) by mouth daily before breakfast. 90 tablet 90  . Melatonin 3 MG TABS Take 2 tablets by mouth daily.    . NONFORMULARY OR COMPOUNDED ITEM VALERIAN SLEEP MED PRN    . NONFORMULARY OR COMPOUNDED ITEM CASCARA SAGRADA 1/2 TAB PO QD    . NONFORMULARY OR COMPOUNDED ITEM Take 1 capsule by mouth daily. Methylcobalamin 1mg  & MethylFolate 10mg . 90 each 0  . progesterone (PROMETRIUM) 100 MG capsule TAKE 3 CAPSULES BY MOUTH  DAILY 270 capsule 0   No current facility-administered medications on file prior to visit.     BP 124/80 (BP Location: Right Arm, Patient Position: Sitting, Cuff Size: Normal)   Pulse 75   Temp 98.4 F (36.9 C) (Oral)   Resp 16   Ht 5\' 1"  (1.549 m)   Wt 129 lb (58.5 kg)   SpO2 99%   BMI 24.37 kg/m       Objective:   Physical Exam   Physical Exam  Constitutional: She is oriented to person, place, and time. She appears well-developed and well-nourished. No distress.  HENT:  Head: Normocephalic and atraumatic.  Right Ear: Tympanic membrane and ear canal normal.  Left Ear: Tympanic membrane and ear canal normal.  Mouth/Throat: Oropharynx is clear and moist.  Eyes: Pupils are equal, round, and reactive to light. No scleral icterus.  Neck: Normal range of motion. No thyromegaly present.  Cardiovascular: Normal rate and regular rhythm.   No murmur heard. Pulmonary/Chest: Effort normal and breath sounds normal. No respiratory distress. He has no wheezes. She has no rales. She exhibits no tenderness.  Abdominal: Soft. Bowel sounds are normal. She exhibits no distension and no mass. There is no tenderness. There is no rebound and no guarding.  Musculoskeletal: She exhibits no edema.    Lymphadenopathy:    She has no cervical adenopathy.  Neurological: She is alert and oriented to person, place, and time. She has normal patellar reflexes. She exhibits normal muscle tone. Coordination normal.  Skin: Skin is warm and dry.  Psychiatric: She has a normal mood and affect. Her behavior is normal. Judgment and thought content normal.  Breasts: Examined lying Right: Without masses, retractions, discharge or axillary  adenopathy.  Left: Without masses, retractions, discharge or axillary adenopathy.  Pelvic: deferred         Assessment & Plan:    Preventative care- discussed healthy diet and regular exercise.  Refer for mammogram/bone density.  Shingrix #1 today.  Colo up to date.      Assessment & Plan:  EKG tracing is personally reviewed.  EKG notes NSR.  No acute changes.

## 2018-03-31 NOTE — Patient Instructions (Signed)
Please complete lab work prior to leaving. Continue healthy diet, exercise. Schedule mammogram and bone density on the first floor.

## 2018-04-04 LAB — VITAMIN D 1,25 DIHYDROXY
VITAMIN D 1, 25 (OH) TOTAL: 41 pg/mL (ref 18–72)
VITAMIN D3 1, 25 (OH): 41 pg/mL
Vitamin D2 1, 25 (OH)2: <8 pg/mL

## 2018-04-26 ENCOUNTER — Encounter: Payer: Self-pay | Admitting: Women's Health

## 2018-05-01 ENCOUNTER — Ambulatory Visit (INDEPENDENT_AMBULATORY_CARE_PROVIDER_SITE_OTHER): Payer: No Typology Code available for payment source | Admitting: Women's Health

## 2018-05-01 ENCOUNTER — Encounter: Payer: Self-pay | Admitting: Women's Health

## 2018-05-01 VITALS — BP 120/82 | Ht 61.0 in | Wt 125.0 lb

## 2018-05-01 DIAGNOSIS — Z01419 Encounter for gynecological examination (general) (routine) without abnormal findings: Secondary | ICD-10-CM

## 2018-05-01 DIAGNOSIS — Z79890 Hormone replacement therapy (postmenopausal): Secondary | ICD-10-CM

## 2018-05-01 DIAGNOSIS — Z113 Encounter for screening for infections with a predominantly sexual mode of transmission: Secondary | ICD-10-CM | POA: Diagnosis not present

## 2018-05-01 MED ORDER — ESTRADIOL 0.1 MG/24HR TD PTTW: MEDICATED_PATCH | TRANSDERMAL | 4 refills | Status: DC

## 2018-05-01 NOTE — Patient Instructions (Addendum)
Osteoporosis Osteoporosis happens when your bones become thinner and weaker. Weak bones can break (fracture) more easily when you slip or fall. Bones most at risk of breaking are in the hip, wrist, and spine. Follow these instructions at home:  Get enough calcium and vitamin D. These nutrients are good for your bones.  Exercise as told by your doctor.  Do not use any tobacco products. This includes cigarettes, chewing tobacco, and electronic cigarettes. If you need help quitting, ask your doctor.  Limit the amount of alcohol you drink.  Take medicines only as told by your doctor.  Keep all follow-up visits as told by your doctor. This is important.  Take care at home to prevent falls. Some ways to do this are: ? Keep rooms well lit and tidy. ? Put safety rails on your stairs. ? Put a rubber mat in the bathroom and other places that are often wet or slippery. Get help right away if:  You fall.  You hurt yourself. This information is not intended to replace advice given to you by your health care provider. Make sure you discuss any questions you have with your health care provider. Document Released: 07/26/2011 Document Revised: 10/09/2015 Document Reviewed: 10/11/2013 Elsevier Interactive Patient Education  2018 [employer] Maintenance for Postmenopausal Women Menopause is a normal process in which your reproductive ability comes to an end. This process happens gradually over a span of months to years, usually between the ages of 44 and 76. Menopause is complete when you have missed 12 consecutive menstrual periods. It is important to talk with your health care provider about some of the most common conditions that affect postmenopausal women, such as heart disease, cancer, and bone loss (osteoporosis). Adopting a healthy lifestyle and getting preventive care can help to promote your health and wellness. Those actions can also lower your chances of developing some of these  common conditions. What should I know about menopause? During menopause, you may experience a number of symptoms, such as:  Moderate-to-severe hot flashes.  Night sweats.  Decrease in sex drive.  Mood swings.  Headaches.  Tiredness.  Irritability.  Memory problems.  Insomnia.  Choosing to treat or not to treat menopausal changes is an individual decision that you make with your health care provider. What should I know about hormone replacement therapy and supplements? Hormone therapy products are effective for treating symptoms that are associated with menopause, such as hot flashes and night sweats. Hormone replacement carries certain risks, especially as you become older. If you are thinking about using estrogen or estrogen with progestin treatments, discuss the benefits and risks with your health care provider. What should I know about heart disease and stroke? Heart disease, heart attack, and stroke become more likely as you age. This may be due, in part, to the hormonal changes that your body experiences during menopause. These can affect how your body processes dietary fats, triglycerides, and cholesterol. Heart attack and stroke are both medical emergencies. There are many things that you can do to help prevent heart disease and stroke:  Have your blood pressure checked at least every 1-2 years. High blood pressure causes heart disease and increases the risk of stroke.  If you are 93-28 years old, ask your health care provider if you should take aspirin to prevent a heart attack or a stroke.  Do not use any tobacco products, including cigarettes, chewing tobacco, or electronic cigarettes. If you need help quitting, ask your health care provider.  It  is important to eat a healthy diet and maintain a healthy weight. ? Be sure to include plenty of vegetables, fruits, low-fat dairy products, and lean protein. ? Avoid eating foods that are high in solid fats, added sugars, or  salt (sodium).  Get regular exercise. This is one of the most important things that you can do for your health. ? Try to exercise for at least 150 minutes each week. The type of exercise that you do should increase your heart rate and make you sweat. This is known as moderate-intensity exercise. ? Try to do strengthening exercises at least twice each week. Do these in addition to the moderate-intensity exercise.  Know your numbers.Ask your health care provider to check your cholesterol and your blood glucose. Continue to have your blood tested as directed by your health care provider.  What should I know about cancer screening? There are several types of cancer. Take the following steps to reduce your risk and to catch any cancer development as early as possible. Breast Cancer  Practice breast self-awareness. ? This means understanding how your breasts normally appear and feel. ? It also means doing regular breast self-exams. Let your health care provider know about any changes, no matter how small.  If you are 24 or older, have a clinician do a breast exam (clinical breast exam or CBE) every year. Depending on your age, family history, and medical history, it may be recommended that you also have a yearly breast X-ray (mammogram).  If you have a family history of breast cancer, talk with your health care provider about genetic screening.  If you are at high risk for breast cancer, talk with your health care provider about having an MRI and a mammogram every year.  Breast cancer (BRCA) gene test is recommended for women who have family members with BRCA-related cancers. Results of the assessment will determine the need for genetic counseling and BRCA1 and for BRCA2 testing. BRCA-related cancers include these types: ? Breast. This occurs in males or females. ? Ovarian. ? Tubal. This may also be called fallopian tube cancer. ? Cancer of the abdominal or pelvic lining (peritoneal  cancer). ? Prostate. ? Pancreatic.  Cervical, Uterine, and Ovarian Cancer Your health care provider may recommend that you be screened regularly for cancer of the pelvic organs. These include your ovaries, uterus, and vagina. This screening involves a pelvic exam, which includes checking for microscopic changes to the surface of your cervix (Pap test).  For women ages 21-65, health care providers may recommend a pelvic exam and a Pap test every three years. For women ages 31-65, they may recommend the Pap test and pelvic exam, combined with testing for human papilloma virus (HPV), every five years. Some types of HPV increase your risk of cervical cancer. Testing for HPV may also be done on women of any age who have unclear Pap test results.  Other health care providers may not recommend any screening for nonpregnant women who are considered low risk for pelvic cancer and have no symptoms. Ask your health care provider if a screening pelvic exam is right for you.  If you have had past treatment for cervical cancer or a condition that could lead to cancer, you need Pap tests and screening for cancer for at least 20 years after your treatment. If Pap tests have been discontinued for you, your risk factors (such as having a new sexual partner) need to be reassessed to determine if you should start having screenings again.  Some women have medical problems that increase the chance of getting cervical cancer. In these cases, your health care provider may recommend that you have screening and Pap tests more often.  If you have a family history of uterine cancer or ovarian cancer, talk with your health care provider about genetic screening.  If you have vaginal bleeding after reaching menopause, tell your health care provider.  There are currently no reliable tests available to screen for ovarian cancer.  Lung Cancer Lung cancer screening is recommended for adults 65-43 years old who are at high risk for  lung cancer because of a history of smoking. A yearly low-dose CT scan of the lungs is recommended if you:  Currently smoke.  Have a history of at least 30 pack-years of smoking and you currently smoke or have quit within the past 15 years. A pack-year is smoking an average of one pack of cigarettes per day for one year.  Yearly screening should:  Continue until it has been 15 years since you quit.  Stop if you develop a health problem that would prevent you from having lung cancer treatment.  Colorectal Cancer  This type of cancer can be detected and can often be prevented.  Routine colorectal cancer screening usually begins at age 36 and continues through age 61.  If you have risk factors for colon cancer, your health care provider may recommend that you be screened at an earlier age.  If you have a family history of colorectal cancer, talk with your health care provider about genetic screening.  Your health care provider may also recommend using home test kits to check for hidden blood in your stool.  A small camera at the end of a tube can be used to examine your colon directly (sigmoidoscopy or colonoscopy). This is done to check for the earliest forms of colorectal cancer.  Direct examination of the colon should be repeated every 5-10 years until age 68. However, if early forms of precancerous polyps or small growths are found or if you have a family history or genetic risk for colorectal cancer, you may need to be screened more often.  Skin Cancer  Check your skin from head to toe regularly.  Monitor any moles. Be sure to tell your health care provider: ? About any new moles or changes in moles, especially if there is a change in a mole's shape or color. ? If you have a mole that is larger than the size of a pencil eraser.  If any of your family members has a history of skin cancer, especially at a Jermarion Poffenberger age, talk with your health care provider about genetic  screening.  Always use sunscreen. Apply sunscreen liberally and repeatedly throughout the day.  Whenever you are outside, protect yourself by wearing long sleeves, pants, a wide-brimmed hat, and sunglasses.  What should I know about osteoporosis? Osteoporosis is a condition in which bone destruction happens more quickly than new bone creation. After menopause, you may be at an increased risk for osteoporosis. To help prevent osteoporosis or the bone fractures that can happen because of osteoporosis, the following is recommended:  If you are 37-61 years old, get at least 1,000 mg of calcium and at least 600 mg of vitamin D per day.  If you are older than age 42 but younger than age 17, get at least 1,200 mg of calcium and at least 600 mg of vitamin D per day.  If you are older than age 47, get  at least 1,200 mg of calcium and at least 800 mg of vitamin D per day.  Smoking and excessive alcohol intake increase the risk of osteoporosis. Eat foods that are rich in calcium and vitamin D, and do weight-bearing exercises several times each week as directed by your health care provider. What should I know about how menopause affects my mental health? Depression may occur at any age, but it is more common as you become older. Common symptoms of depression include:  Low or sad mood.  Changes in sleep patterns.  Changes in appetite or eating patterns.  Feeling an overall lack of motivation or enjoyment of activities that you previously enjoyed.  Frequent crying spells.  Talk with your health care provider if you think that you are experiencing depression. What should I know about immunizations? It is important that you get and maintain your immunizations. These include:  Tetanus, diphtheria, and pertussis (Tdap) booster vaccine.  Influenza every year before the flu season begins.  Pneumonia vaccine.  Shingles vaccine.  Your health care provider may also recommend other  immunizations. This information is not intended to replace advice given to you by your health care provider. Make sure you discuss any questions you have with your health care provider. Document Released: 06/25/2005 Document Revised: 11/21/2015 Document Reviewed: 02/04/2015 Elsevier Interactive Patient Education  2018 [employer].

## 2018-05-01 NOTE — Progress Notes (Signed)
Christina SalinesJudy E Bartling 05/14/1956 [id number]    History:    Presents for annual exam.  2002 TVH for fibroids on Vivelle patch.  Normal Pap and mammogram history.    Primary care manages labs.  Hypothyroid.  03/2017- colonoscopy.  2017 T score -2.6 declined medication, does yoga, walks, weights on a regular basis.  Not sexually active.  Past medical history, past surgical history, family history and social history were all reviewed and documented in the EPIC chart.  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  ROS:  A ROS was performed and pertinent positives and negatives are included.  Exam:  Vitals:   05/01/18 1505  BP: 120/82  Weight: 125 lb (56.7 kg)  Height: 5\' 1"  (1.549 m)   Body mass index is 23.62 kg/m.   General appearance:  Normal Thyroid:  Symmetrical, normal in size, without palpable masses or nodularity. Respiratory  Auscultation:  Clear without wheezing or rhonchi Cardiovascular  Auscultation:  Regular rate, without rubs, murmurs or gallops  Edema/varicosities:  Not grossly evident Abdominal  Soft,nontender, without masses, guarding or rebound.  Liver/spleen:  No organomegaly noted  Hernia:  None appreciated  Skin  Inspection:  Grossly normal   Breasts: Examined lying and sitting.     Right: Without masses, retractions, discharge or axillary adenopathy.     Left: Without masses, retractions, discharge or axillary adenopathy. Gentitourinary   Inguinal/mons:  Normal without inguinal adenopathy  External genitalia:  Normal  BUS/Urethra/Skene's glands:  Normal  Vagina: +1 rectocele  Cervix: And uterus absent  Adnexa/parametria:     Rt: Without masses or tenderness.   Lt: Without masses or tenderness.  Anus and perineum: Normal  Digital rectal exam: Normal sphincter tone without palpated masses or tenderness  Assessment/Plan:  62 y.o. D WF G3, P3 for annual exam with no complaints.  2002 TVH for fibroids on  HRT Osteoporosis declined  treatment Hypothyroid-primary care manages labs and meds Mild rectocele occasional pressure only.  Plan: Vivelle patch 0.1 uses half patch 3 times weekly, continues to have hot flushes.  Reviewed risks of blood clots, strokes and breast cancer.  SBEs, continue annual screening mammogram, calcium rich foods, vitamin D 2000 daily.  Home safety, fall prevention and importance of continuing regular weightbearing/balance exercise, and weights.   Harrington Challengerancy J Young [facility] BaptistWHNP, 3:49 PM 05/01/2018

## 2018-05-24 ENCOUNTER — Other Ambulatory Visit: Payer: Self-pay | Admitting: Women's Health

## 2018-05-24 ENCOUNTER — Telehealth: Payer: Self-pay | Admitting: *Deleted

## 2018-05-24 DIAGNOSIS — E038 Other specified hypothyroidism: Secondary | ICD-10-CM

## 2018-05-24 DIAGNOSIS — Z79890 Hormone replacement therapy (postmenopausal): Secondary | ICD-10-CM

## 2018-05-24 MED ORDER — ESTRADIOL 0.1 MG/24HR TD PTTW: MEDICATED_PATCH | TRANSDERMAL | 3 refills | Status: DC

## 2018-05-24 MED ORDER — LEVOTHYROXINE SODIUM 88 MCG PO TABS: 88.0000 ug | ORAL_TABLET | Freq: Every day | ORAL | 3 refills | Status: DC

## 2018-05-24 MED ORDER — PROGESTERONE MICRONIZED 100 MG PO CAPS: 100.0000 mg | ORAL_CAPSULE | Freq: Every day | ORAL | 3 refills | Status: DC

## 2018-05-24 MED FILL — LEVOTHYROXINE 88 MCG TABLET: 88 | 90 days supply | Qty: 90 | Fill #0

## 2018-05-24 MED FILL — DOTTI 0.1 MG/24HR PTTW: 0.1 | 74 days supply | Qty: 16 | Fill #0

## 2018-05-24 NOTE — Telephone Encounter (Signed)
Spoke with patient and she informed with the below, Christina Russo to me while on phone with patient that no need for labs since she had done at PCP office in Nov. 2019,  Patient also said that the progesterone makes her feel better overall with sleep etc. I relayed this to [place] and she said this was fine as well. All 3 Rx sent.

## 2018-05-24 NOTE — Telephone Encounter (Signed)
Patient called requesting HRT vivelle dot patch and progesterone 100 mg, synthroid 88 mcg be sent to pharmacy [facility] outpatient, Rx's were not sent at annual visit on 05/01/18. Per note states PCP handles Rx and labs, patient said you have been prescribing?

## 2018-05-24 NOTE — Telephone Encounter (Signed)
Okay for Vivelle . 1 patch twice weekly #24 with 4 refills, I think she was using a half a patch tell her we can call in the Vivelle  0.5 patch.  Synthroid 88 mcg, review she does not need the progesterone she does not have a uterus.  We will need to get a TSH. ask if she has had glucose and cholesterol checked if not we could do a CBC, CMP, lipid panel and TSH and she can come by the lab fasting.  Have her make a lab appointment.  Sorry for the confusion.

## 2018-05-25 NOTE — Telephone Encounter (Signed)
Pharmacy sent this to you because patient told them she is taking 3 Prometrium caps daily.  Directions are for one daily. Please advise if you want to alter directions.

## 2018-05-25 NOTE — Telephone Encounter (Signed)
This was originally ordered by [facility], she has had a hyst, states feels better with the progesterone BUT needs only one /day.

## 2018-06-01 ENCOUNTER — Ambulatory Visit: Payer: No Typology Code available for payment source

## 2018-06-05 ENCOUNTER — Ambulatory Visit (HOSPITAL_BASED_OUTPATIENT_CLINIC_OR_DEPARTMENT_OTHER): Payer: No Typology Code available for payment source

## 2018-06-05 ENCOUNTER — Other Ambulatory Visit (HOSPITAL_BASED_OUTPATIENT_CLINIC_OR_DEPARTMENT_OTHER): Payer: No Typology Code available for payment source

## 2018-06-09 MED FILL — PROGESTERONE 100 MG CAPSULE: 100 | 90 days supply | Qty: 90 | Fill #0

## 2018-06-15 ENCOUNTER — Ambulatory Visit: Payer: No Typology Code available for payment source

## 2018-06-15 ENCOUNTER — Ambulatory Visit (INDEPENDENT_AMBULATORY_CARE_PROVIDER_SITE_OTHER): Payer: No Typology Code available for payment source | Admitting: *Deleted

## 2018-06-15 DIAGNOSIS — Z23 Encounter for immunization: Secondary | ICD-10-CM

## 2018-08-29 MED FILL — PROGESTERONE 100 MG CAPSULE: 100 | 90 days supply | Qty: 90 | Fill #1

## 2018-08-29 MED FILL — LEVOTHYROXINE 88 MCG TABLET: 88 | 90 days supply | Qty: 90 | Fill #1

## 2018-11-13 MED FILL — CLINDAMYCIN HCL 300 MG CAPS: 300 | 7 days supply | Qty: 21 | Fill #0

## 2018-11-13 MED FILL — traMADol HCL 50 MG TABS: 50 | 7 days supply | Qty: 20 | Fill #0

## 2018-11-28 MED FILL — traMADol HCL 50 MG TABS: 50 | 1 days supply | Qty: 5 | Fill #0

## 2018-11-28 MED FILL — CHLORHEXIDINE 0.12% RINSE: 0.12 | 15 days supply | Qty: 473 | Fill #0

## 2018-11-28 MED FILL — IBUPROFEN 400 MG TABS: 400 | 7 days supply | Qty: 30 | Fill #0

## 2018-11-29 MED FILL — DOTTI 0.1 MG/24HR PTTW: 0.1 | 74 days supply | Qty: 16 | Fill #1

## 2018-11-29 MED FILL — LEVOTHYROXINE 88 MCG TABLET: 88 | 90 days supply | Qty: 90 | Fill #2

## 2018-11-29 MED FILL — PROGESTERONE 100 MG CAPSULE: 100 | 90 days supply | Qty: 90 | Fill #2

## 2019-03-19 MED FILL — LEVOTHYROXINE 88 MCG TABLET: 88 | 30 days supply | Qty: 30 | Fill #3

## 2019-03-20 ENCOUNTER — Telehealth: Payer: Self-pay

## 2019-03-20 DIAGNOSIS — E038 Other specified hypothyroidism: Secondary | ICD-10-CM

## 2019-03-20 DIAGNOSIS — Z79890 Hormone replacement therapy (postmenopausal): Secondary | ICD-10-CM

## 2019-03-20 MED ORDER — LEVOTHYROXINE SODIUM 88 MCG PO TABS: 88.0000 ug | ORAL_TABLET | Freq: Every day | ORAL | 0 refills | Status: DC

## 2019-03-20 MED ORDER — PROGESTERONE MICRONIZED 100 MG PO CAPS: ORAL_CAPSULE | ORAL | 0 refills | Status: DC

## 2019-03-20 NOTE — Telephone Encounter (Addendum)
So I called patient to let her know. She said that [facility] and that person was who recommended Progesterone. She was taking 300 mg. A day. However, she talked to you about it and you told her you would recommend 100 mg a day and you would prescribe it for her and you did.  She said she forgot to talk with you about it at [place] but called 05/24/18 and you had okayed it.    I will go ahead and reroute Rx to [facility] if okay with you.

## 2019-03-20 NOTE — Telephone Encounter (Signed)
Ok, thanks.

## 2019-03-20 NOTE — Telephone Encounter (Signed)
[facility] sent form requesting new Rx for Progesterone.  You did not mention her needing progesterone in her annual visit.  She has had TVH per your office note.  Please advise regarding Rx.

## 2019-03-20 NOTE — Telephone Encounter (Signed)
Rx sent 

## 2019-03-20 NOTE — Telephone Encounter (Signed)
You are right she does not need progesterone, not sure who had prescribed that.

## 2019-05-07 ENCOUNTER — Other Ambulatory Visit: Payer: Self-pay

## 2019-05-08 ENCOUNTER — Ambulatory Visit (INDEPENDENT_AMBULATORY_CARE_PROVIDER_SITE_OTHER): Payer: PRIVATE HEALTH INSURANCE | Admitting: Women's Health

## 2019-05-08 ENCOUNTER — Encounter: Payer: Self-pay | Admitting: Women's Health

## 2019-05-08 VITALS — BP 118/78 | Ht 61.0 in | Wt 129.0 lb

## 2019-05-08 DIAGNOSIS — Z13220 Encounter for screening for lipoid disorders: Secondary | ICD-10-CM | POA: Diagnosis not present

## 2019-05-08 DIAGNOSIS — Z79890 Hormone replacement therapy (postmenopausal): Secondary | ICD-10-CM

## 2019-05-08 DIAGNOSIS — Z01419 Encounter for gynecological examination (general) (routine) without abnormal findings: Secondary | ICD-10-CM

## 2019-05-08 DIAGNOSIS — E038 Other specified hypothyroidism: Secondary | ICD-10-CM | POA: Diagnosis not present

## 2019-05-08 MED ORDER — ESTRADIOL 0.0375 MG/24HR TD PTTW: 1.0000 | MEDICATED_PATCH | TRANSDERMAL | 4 refills | Status: DC

## 2019-05-08 MED ORDER — PROGESTERONE MICRONIZED 100 MG PO CAPS: ORAL_CAPSULE | ORAL | 4 refills | Status: DC

## 2019-05-08 MED ORDER — LEVOTHYROXINE SODIUM 88 MCG PO TABS: 88.0000 ug | ORAL_TABLET | Freq: Every day | ORAL | 4 refills | Status: DC

## 2019-05-08 NOTE — Progress Notes (Signed)
Christina Russo August 20, 1955 [id number]    History:    Presents for annual exam.  2002 TVH on HRT.  States is unable to tolerate being off, currently on 0.5 patch twice weekly and Prometrium, has been on Prometrium 300 mg daily past year 100 mg daily aware it is to help protect uterus does not need but states feels better when taking.  Reviewed risks of blood clots, strokes and breast cancer with prolonged HRT, aware.  States does not feel well when off.  Normal Pap and mammogram history, overdue for mammogram.  Not sexually active.  2017 T score -2.6 declined medication or treatment.  2018 - colonoscopy.  Not sexually active.  Hypothyroid.  Has had Shingrix not Pneumovax.    Past medical history, past surgical history, family history and social history were all reviewed and documented in the EPIC chart.  Works in [employer].  3 children all doing well.  ROS:  A ROS was performed and pertinent positives and negatives are included.  Exam:  Vitals:   05/08/19 0833  BP: 118/78  Weight: 129 lb (58.5 kg)  Height: 5\' 1"  (1.549 m)   Body mass index is 24.37 kg/m.   General appearance:  Normal Thyroid:  Symmetrical, normal in size, without palpable masses or nodularity. Respiratory  Auscultation:  Clear without wheezing or rhonchi Cardiovascular  Auscultation:  Regular rate, without rubs, murmurs or gallops  Edema/varicosities:  Not grossly evident Abdominal  Soft,nontender, without masses, guarding or rebound.  Liver/spleen:  No organomegaly noted  Hernia:  None appreciated  Skin  Inspection:  Grossly normal   Breasts: Examined lying and sitting.     Right: Without masses, retractions, discharge or axillary adenopathy.     Left: Without masses, retractions, discharge or axillary adenopathy. Gentitourinary   Inguinal/mons:  Normal without inguinal adenopathy  External genitalia:  Normal  BUS/Urethra/Skene's glands:  Normal  Vagina: Mild rectocele, vaginal atrophy    Cervix: And uterus absent   Adnexa/parametria:     Rt: Without masses or tenderness.   Lt: Without masses or tenderness.  Anus and perineum: Normal  Digital rectal exam: Normal sphincter tone without palpated masses or tenderness  Assessment/Plan:  63 y.o. D WF G3, P3 for annual exam with no complaints.  2002 TVH on HRT Osteoporosis declined medication/treatment Hypothyroid on Synthroid  Plan: Osteoporosis discussed, declines repeat DEXA ordered medication.  Reviewed importance of continuing regular weightbearing and balance exercise does do yoga and weights.  Home safety, fall prevention discussed.  HRT reviewed risks of blood clots, strokes and breast cancer, women's health initiative prefers to continue, will try Vivelle 0.0375 prescription reviewed less risk with lower dose and continue the Prometrium 100 mg at bedtime.  Synthroid 88 mcg p.o. daily prescription, proper use given and reviewed.  CBC, TSH, CMP, lipid panel, Pap screening guidelines reviewed. Pneumovax discussed instructed to get at primary care.    [place], 12:58 PM 05/08/2019

## 2019-05-08 NOTE — Patient Instructions (Signed)
Good to see you today! [place] Maintenance After Age 65 After age 17, you are at a higher risk for certain long-term diseases and infections as well as injuries from falls. Falls are a major cause of broken bones and head injuries in people who are older than age 24. Getting regular preventive care can help to keep you healthy and well. Preventive care includes getting regular testing and making lifestyle changes as recommended by your health care provider. Talk with your health care provider about:  Which screenings and tests you should have. A screening is a test that checks for a disease when you have no symptoms.  A diet and exercise plan that is right for you. What should I know about screenings and tests to prevent falls? Screening and testing are the best ways to find a health problem early. Early diagnosis and treatment give you the best chance of managing medical conditions that are common after age 53. Certain conditions and lifestyle choices may make you more likely to have a fall. Your health care provider may recommend:  Regular vision checks. Poor vision and conditions such as cataracts can make you more likely to have a fall. If you wear glasses, make sure to get your prescription updated if your vision changes.  Medicine review. Work with your health care provider to regularly review all of the medicines you are taking, including over-the-counter medicines. Ask your health care provider about any side effects that may make you more likely to have a fall. Tell your health care provider if any medicines that you take make you feel dizzy or sleepy.  Osteoporosis screening. Osteoporosis is a condition that causes the bones to get weaker. This can make the bones weak and cause them to break more easily.  Blood pressure screening. Blood pressure changes and medicines to control blood pressure can make you feel dizzy.  Strength and balance checks. Your health care provider  may recommend certain tests to check your strength and balance while standing, walking, or changing positions.  Foot health exam. Foot pain and numbness, as well as not wearing proper footwear, can make you more likely to have a fall.  Depression screening. You may be more likely to have a fall if you have a fear of falling, feel emotionally low, or feel unable to do activities that you used to do.  Alcohol use screening. Using too much alcohol can affect your balance and may make you more likely to have a fall. What actions can I take to lower my risk of falls? General instructions  Talk with your health care provider about your risks for falling. Tell your health care provider if: ? You fall. Be sure to tell your health care provider about all falls, even ones that seem minor. ? You feel dizzy, sleepy, or off-balance.  Take over-the-counter and prescription medicines only as told by your health care provider. These include any supplements.  Eat a healthy diet and maintain a healthy weight. A healthy diet includes low-fat dairy products, low-fat (lean) meats, and fiber from whole grains, beans, and lots of fruits and vegetables. Home safety  Remove any tripping hazards, such as rugs, cords, and clutter.  Install safety equipment such as grab bars in bathrooms and safety rails on stairs.  Keep rooms and walkways well-lit. Activity   Follow a regular exercise program to stay fit. This will help you maintain your balance. Ask your health care provider what types of exercise are appropriate for you.  If you need a cane or walker, use it as recommended by your health care provider.  Wear supportive shoes that have nonskid soles. Lifestyle  Do not drink alcohol if your health care provider tells you not to drink.  If you drink alcohol, limit how much you have: ? 0-1 drink a day for women. ? 0-2 drinks a day for men.  Be aware of how much alcohol is in your drink. In the [place] one  drink equals one typical bottle of beer (12 oz), one-half glass of wine (5 oz), or one shot of hard liquor (1 oz).  Do not use any products that contain nicotine or tobacco, such as cigarettes and e-cigarettes. If you need help quitting, ask your health care provider. Summary  Having a healthy lifestyle and getting preventive care can help to protect your health and wellness after age 108.  Screening and testing are the best way to find a health problem early and help you avoid having a fall. Early diagnosis and treatment give you the best chance for managing medical conditions that are more common for people who are older than age 64.  Falls are a major cause of broken bones and head injuries in people who are older than age 42. Take precautions to prevent a fall at home.  Work with your health care provider to learn what changes you can make to improve your health and wellness and to prevent falls. This information is not intended to replace advice given to you by your health care provider. Make sure you discuss any questions you have with your health care provider. Document Released: 03/16/2017 Document Revised: 08/24/2018 Document Reviewed: 03/16/2017 Elsevier Patient Education  2020 [employer].

## 2019-05-10 LAB — COMPREHENSIVE METABOLIC PANEL
AG Ratio: 1.8 (calc) (ref 1.0–2.5)
ALT: 14 U/L (ref 6–29)
AST: 16 U/L (ref 10–35)
Albumin: 4.3 g/dL (ref 3.6–5.1)
Alkaline phosphatase (APISO): 61 U/L (ref 37–153)
BUN: 17 mg/dL (ref 7–25)
CO2: 21 mmol/L (ref 20–32)
Calcium: 9.3 mg/dL (ref 8.6–10.4)
Chloride: 104 mmol/L (ref 98–110)
Creat: 0.77 mg/dL (ref 0.50–0.99)
Globulin: 2.4 g/dL (calc) (ref 1.9–3.7)
Glucose, Bld: 108 mg/dL — ABNORMAL HIGH (ref 65–99)
Potassium: 4.4 mmol/L (ref 3.5–5.3)
Sodium: 138 mmol/L (ref 135–146)
Total Bilirubin: 0.4 mg/dL (ref 0.2–1.2)
Total Protein: 6.7 g/dL (ref 6.1–8.1)

## 2019-05-10 LAB — CBC WITH DIFFERENTIAL/PLATELET
Absolute Monocytes: 630 cells/uL (ref 200–950)
Basophils Absolute: 42 cells/uL (ref 0–200)
Basophils Relative: 0.7 %
Eosinophils Absolute: 168 cells/uL (ref 15–500)
Eosinophils Relative: 2.8 %
HCT: 39.9 % (ref 35.0–45.0)
Hemoglobin: 13.6 g/dL (ref 11.7–15.5)
Lymphs Abs: 2004 cells/uL (ref 850–3900)
MCH: 32.2 pg (ref 27.0–33.0)
MCHC: 34.1 g/dL (ref 32.0–36.0)
MCV: 94.3 fL (ref 80.0–100.0)
MPV: 12.4 fL (ref 7.5–12.5)
Monocytes Relative: 10.5 %
Neutro Abs: 3156 cells/uL (ref 1500–7800)
Neutrophils Relative %: 52.6 %
Platelets: 240 10*3/uL (ref 140–400)
RBC: 4.23 10*6/uL (ref 3.80–5.10)
RDW: 12.6 % (ref 11.0–15.0)
Total Lymphocyte: 33.4 %
WBC: 6 10*3/uL (ref 3.8–10.8)

## 2019-05-10 LAB — HEMOGLOBIN A1C
Hgb A1c MFr Bld: 5.2 % of total Hgb (ref <5.7)
Mean Plasma Glucose: 103 (calc)
eAG (mmol/L): 5.7 (calc)

## 2019-05-10 LAB — TEST AUTHORIZATION

## 2019-05-10 LAB — LIPID PANEL
Cholesterol: 178 mg/dL (ref <200)
HDL: 102 mg/dL (ref >=50)
LDL Cholesterol (Calc): 63 mg/dL (calc)
Non-HDL Cholesterol (Calc): 76 mg/dL (calc) (ref <130)
Total CHOL/HDL Ratio: 1.7 (calc) (ref <5.0)
Triglycerides: 57 mg/dL (ref <150)

## 2019-05-10 LAB — TSH: TSH: 0.72 mIU/L (ref 0.40–4.50)

## 2019-07-16 ENCOUNTER — Telehealth: Payer: Self-pay

## 2019-07-16 NOTE — Telephone Encounter (Signed)
Patient's pharmacy called in to see if Christina Russo can send in a prescription for  NONFORMULARY OR COMPOUNDED ITEM [[id number]]

## 2019-07-17 ENCOUNTER — Telehealth: Payer: Self-pay

## 2019-07-17 NOTE — Telephone Encounter (Signed)
Has not been seen since 2019, was given appointment to see [facility]

## 2019-07-20 ENCOUNTER — Telehealth: Payer: Self-pay | Admitting: Family

## 2019-07-20 NOTE — Telephone Encounter (Signed)
Medication request.

## 2019-07-20 NOTE — Telephone Encounter (Signed)
Medication: MethyFolate 10mg  Methycobalamin 1mg   Located under the 3RD NON FORMULARY  -COMPOUNDED ITEM on med list.  Has the patient contacted their pharmacy? Yes  (If no, request that the patient contact the pharmacy for the refill.) (If yes, when and what did the pharmacy advise?)  Preferred Pharmacy (with phone number or street name): [facility]  [address], [place] [address] [place]  Phone:  [phone] Fax:  [phone]   Agent: Please be advised that RX refills may take up to 3 business days. We ask that you follow-up with your pharmacy.

## 2019-07-21 NOTE — Telephone Encounter (Signed)
I haven't seen her in 2 years.  I will need to see her for an office visit before I can refill.

## 2019-07-26 ENCOUNTER — Other Ambulatory Visit: Payer: Self-pay

## 2019-07-27 ENCOUNTER — Ambulatory Visit: Payer: No Typology Code available for payment source | Admitting: Family

## 2019-07-30 ENCOUNTER — Encounter: Payer: Self-pay | Admitting: Family

## 2019-07-30 ENCOUNTER — Ambulatory Visit (INDEPENDENT_AMBULATORY_CARE_PROVIDER_SITE_OTHER): Payer: PRIVATE HEALTH INSURANCE | Admitting: Family

## 2019-07-30 ENCOUNTER — Other Ambulatory Visit: Payer: Self-pay

## 2019-07-30 VITALS — BP 130/73 | HR 76 | Temp 98.4°F

## 2019-07-30 DIAGNOSIS — F429 Obsessive-compulsive disorder, unspecified: Secondary | ICD-10-CM

## 2019-07-30 DIAGNOSIS — Z0000 Encounter for general adult medical examination without abnormal findings: Secondary | ICD-10-CM

## 2019-07-30 DIAGNOSIS — M8580 Other specified disorders of bone density and structure, unspecified site: Secondary | ICD-10-CM

## 2019-07-30 MED ORDER — NONFORMULARY OR COMPOUNDED ITEM: 1.0000 | Freq: Every day | 3 refills | Status: DC

## 2019-07-30 NOTE — Progress Notes (Signed)
Virtual Visit via Video Note  I connected with Christina Russo on 07/30/19 at 12:40 PM EDT by a video enabled telemedicine application and verified that I am speaking with the correct person using two identifiers.  Location: Patient: home Provider: home   I discussed the limitations of evaluation and management by telemedicine and the availability of in person appointments. The patient expressed understanding and agreed to proceed.  History of Present Illness:  Patient is a 64 yr old female who presents today for routine follow up.   Hypothyroid- maintained on synthroid which is being managed by GYN.  Estrogen deficiency- maintained on vivelle-dot and prometrium. This is also being managed by GYN.  OCD- She is on a compounded formula Take 1 capsule by mouth daily. Methylcobalamin 1mg  & MethylFolate 10mg .  She uses this for OCD. Reports that it helps her a lot.  She stopped it for a while and soon realized how much it had been helping her so she restarted.   Observations/Objective:   Gen: Awake, alert, no acute distress Resp: Breathing is even and non-labored Psych: calm/pleasant demeanor Neuro: Alert and Oriented x 3, + facial symmetry, speech is clear.   Assessment and Plan:  OCD-  Stable on compounded Methylcobalamin 1mg  & MethylFolate 10mg .   Will continue same.  Osteopenia- will obtain follow up dexa scan. Also due for mammogram. Will order.  Follow Up Instructions:    I discussed the assessment and treatment plan with the patient. The patient was provided an opportunity to ask questions and all were answered. The patient agreed with the plan and demonstrated an understanding of the instructions.   The patient was advised to call back or seek an in-person evaluation if the symptoms worsen or if the condition fails to improve as anticipated.  , NP

## 2019-08-23 ENCOUNTER — Ambulatory Visit (HOSPITAL_BASED_OUTPATIENT_CLINIC_OR_DEPARTMENT_OTHER)
Admission: RE | Admit: 2019-08-23 | Discharge: 2019-08-23 | Disposition: A | Discharge status: Home / Self Care | Payer: PRIVATE HEALTH INSURANCE | Source: Ambulatory Visit | Attending: Family | Admitting: Family

## 2019-08-23 ENCOUNTER — Other Ambulatory Visit: Payer: Self-pay

## 2019-08-23 ENCOUNTER — Encounter (HOSPITAL_BASED_OUTPATIENT_CLINIC_OR_DEPARTMENT_OTHER): Payer: Self-pay

## 2019-08-23 DIAGNOSIS — M8580 Other specified disorders of bone density and structure, unspecified site: Secondary | ICD-10-CM

## 2019-08-23 DIAGNOSIS — Z0000 Encounter for general adult medical examination without abnormal findings: Secondary | ICD-10-CM | POA: Insufficient documentation

## 2019-08-23 DIAGNOSIS — Z1231 Encounter for screening mammogram for malignant neoplasm of breast: Secondary | ICD-10-CM | POA: Insufficient documentation

## 2019-08-24 ENCOUNTER — Telehealth: Payer: Self-pay | Admitting: Family

## 2019-08-24 NOTE — Telephone Encounter (Signed)
Please let pt know that the radiologist would like her to complete some additional breast images for further evaluation. Let me know if she has not been contacted by them about a follow up appointment in 1 week.   

## 2019-08-27 ENCOUNTER — Other Ambulatory Visit: Payer: Self-pay | Admitting: Family

## 2019-08-27 DIAGNOSIS — R928 Other abnormal and inconclusive findings on diagnostic imaging of breast: Secondary | ICD-10-CM

## 2019-08-27 NOTE — Telephone Encounter (Signed)
lvm for patient to be aware of phone call from radiology about getting additional images.

## 2019-09-10 ENCOUNTER — Encounter: Payer: Self-pay | Admitting: Family

## 2019-09-10 DIAGNOSIS — Z0000 Encounter for general adult medical examination without abnormal findings: Secondary | ICD-10-CM

## 2019-09-10 DIAGNOSIS — M8580 Other specified disorders of bone density and structure, unspecified site: Secondary | ICD-10-CM | POA: Insufficient documentation

## 2019-09-28 ENCOUNTER — Ambulatory Visit: Payer: PRIVATE HEALTH INSURANCE | Admitting: Family

## 2019-10-02 ENCOUNTER — Ambulatory Visit: Payer: PRIVATE HEALTH INSURANCE | Admitting: Family

## 2019-10-02 NOTE — Telephone Encounter (Signed)
Patient called to find out if she can get a "pneumonia shot" Please advise.

## 2019-10-03 ENCOUNTER — Other Ambulatory Visit: Payer: Self-pay | Admitting: Nurse Practitioner

## 2019-10-03 NOTE — Telephone Encounter (Signed)
Yes, please schedule pt for a pneumovax 23.

## 2019-10-09 ENCOUNTER — Ambulatory Visit: Payer: PRIVATE HEALTH INSURANCE | Admitting: Family

## 2019-11-06 NOTE — Telephone Encounter (Signed)
Pt has cancelled last 3 nurse visits for pneumovax.

## 2020-01-04 ENCOUNTER — Telehealth: Payer: Self-pay | Admitting: Family

## 2020-01-04 NOTE — Telephone Encounter (Signed)
Yes, please schedule for pneumovax 23.

## 2020-01-04 NOTE — Telephone Encounter (Signed)
Patient is requesting a pneumonia vaccine .  Please Advise

## 2020-01-07 NOTE — Telephone Encounter (Signed)
Patient was scheduled for Thursday afternoon at her request

## 2020-01-10 ENCOUNTER — Other Ambulatory Visit: Payer: Self-pay

## 2020-01-10 ENCOUNTER — Ambulatory Visit (INDEPENDENT_AMBULATORY_CARE_PROVIDER_SITE_OTHER): Payer: No Typology Code available for payment source

## 2020-01-10 DIAGNOSIS — Z23 Encounter for immunization: Secondary | ICD-10-CM | POA: Diagnosis not present

## 2020-01-10 NOTE — Progress Notes (Signed)
Patient came in today for a Pneumovax-23 per Sandford Craze, NP.  Pneumovax-23 given left deltoid, IM , patient tolerated injection well.

## 2020-01-18 ENCOUNTER — Encounter: Payer: Self-pay | Admitting: Family

## 2020-01-30 ENCOUNTER — Telehealth: Payer: Self-pay | Admitting: *Deleted

## 2020-01-30 MED ORDER — PROGESTERONE MICRONIZED 100 MG PO CAPS: 100.0000 mg | ORAL_CAPSULE | Freq: Every day | ORAL | 0 refills | Status: DC

## 2020-01-30 NOTE — Telephone Encounter (Signed)
Patient called requesting her progesterone 100 mg tablet sent to local [facility], Rx sent. Annual scheduled with Tiffany on 05/13/20

## 2020-02-28 ENCOUNTER — Encounter: Payer: Self-pay | Admitting: Family

## 2020-04-09 ENCOUNTER — Other Ambulatory Visit: Payer: Self-pay

## 2020-04-09 DIAGNOSIS — E038 Other specified hypothyroidism: Secondary | ICD-10-CM

## 2020-04-09 MED ORDER — LEVOTHYROXINE SODIUM 88 MCG PO TABS: 88.0000 ug | ORAL_TABLET | Freq: Every day | ORAL | 0 refills | Status: DC

## 2020-04-09 NOTE — Telephone Encounter (Signed)
Annual exam scheduled with TW for 05/13/20.

## 2020-04-30 ENCOUNTER — Other Ambulatory Visit: Payer: Self-pay | Admitting: Nurse Practitioner

## 2020-04-30 NOTE — Telephone Encounter (Signed)
Has CE scheduled with TW 05/07/27.

## 2020-05-13 ENCOUNTER — Other Ambulatory Visit: Payer: Self-pay

## 2020-05-13 ENCOUNTER — Encounter: Payer: Self-pay | Admitting: Nurse Practitioner

## 2020-05-13 ENCOUNTER — Ambulatory Visit (INDEPENDENT_AMBULATORY_CARE_PROVIDER_SITE_OTHER): Payer: No Typology Code available for payment source | Admitting: Nurse Practitioner

## 2020-05-13 VITALS — BP 126/78 | Ht 61.0 in | Wt 136.0 lb

## 2020-05-13 DIAGNOSIS — Z90710 Acquired absence of both cervix and uterus: Secondary | ICD-10-CM

## 2020-05-13 DIAGNOSIS — M8589 Other specified disorders of bone density and structure, multiple sites: Secondary | ICD-10-CM

## 2020-05-13 DIAGNOSIS — Z01419 Encounter for gynecological examination (general) (routine) without abnormal findings: Secondary | ICD-10-CM

## 2020-05-13 DIAGNOSIS — Z79890 Hormone replacement therapy (postmenopausal): Secondary | ICD-10-CM | POA: Diagnosis not present

## 2020-05-13 MED ORDER — PROGESTERONE MICRONIZED 100 MG PO CAPS: ORAL_CAPSULE | ORAL | 3 refills | Status: DC

## 2020-05-13 MED ORDER — ESTRADIOL 0.0375 MG/24HR TD PTTW: 1.0000 | MEDICATED_PATCH | TRANSDERMAL | 4 refills | Status: DC

## 2020-05-13 NOTE — Progress Notes (Signed)
   Christina Russo 1955-08-15 [id number]   History:  64 y.o. G3P0003 presents for breast and pelvic exam. 2002 TVH on Vivelle patch and Prometrium. She requests Prometrium as she feels better while taking. Normal pap history. Mammogram in April showed left breast distortion but patient did not follow up due to issues with billing and being charged for diagnostic versus screening. History of hypothyroidism and osteoporosis. She plans to have TSH done with PCP. Declined treatment for osteoporosis, most recent DEXA showed osteopenia.   Gynecologic History No LMP recorded. Patient has had a hysterectomy.   Contraception: status post hysterectomy Last Pap: No longer screening per guidelines Last mammogram: 08/2019. Results were: left breast distortion Last colonoscopy: 2018. Results were: normal Last Dexa: 08/2019. Results were: t-score -2.3  Past medical history, past surgical history, family history and social history were all reviewed and documented in the EPIC chart.  ROS:  A ROS was performed and pertinent positives and negatives are included.  Exam:  Vitals:   05/13/20 0802  BP: 126/78  Weight: 136 lb (61.7 kg)  Height: 5\' 1"  (1.549 m)   Body mass index is 25.7 kg/m.  General appearance:  Normal Thyroid:  Symmetrical, normal in size, without palpable masses or nodularity. Respiratory  Auscultation:  Clear without wheezing or rhonchi Cardiovascular  Auscultation:  Regular rate, without rubs, murmurs or gallops  Edema/varicosities:  Not grossly evident Abdominal  Soft,nontender, without masses, guarding or rebound.  Liver/spleen:  No organomegaly noted  Hernia:  None appreciated  Skin  Inspection:  Grossly normal   Breasts: Examined lying and sitting.   Right: Without masses, retractions, discharge or axillary adenopathy.   Left: Without masses, retractions, discharge or axillary adenopathy. Gentitourinary   Inguinal/mons:  Normal without inguinal adenopathy  External  genitalia:  Normal  BUS/Urethra/Skene's glands:  Normal  Vagina:  Normal  Cervix:  Absent  Uterus:  Absent  Adnexa/parametria:     Rt: Without masses or tenderness.   Lt: Without masses or tenderness.  Anus and perineum: Normal  Digital rectal exam: Normal sphincter tone without palpated masses or tenderness  Assessment/Plan:  64 y.o. G3P0003 for breast and pelvic exam.   Well female exam with routine gynecological exam - Education provided on SBEs, importance of preventative screenings, current guidelines, high calcium diet, regular exercise, and multivitamin daily. She plans to get lab work done with PCP.   Hormone replacement therapy (HRT) - Plan: estradiol (VIVELLE-DOT) 0.0375 MG/24HR, progesterone (PROMETRIUM) 100 MG capsule.  Discussed risks versus benefit.  She would like to continue.  Refill x1 year provided.  History of total vaginal hysterectomy (TVH) - 2002  Osteopenia of multiple sites -history of osteoporosis.  Most recent DEXA showed T score -2.3.  She is taking daily vitamin D supplement and exercising regularly.  She is not interested in treatment in the future.  Screening for cervical cancer - Normal Pap history.  No longer screening per guidelines.  Screening for breast cancer -most recent mammogram April 2021 showed distortion of left breast with recommendations to follow-up for ultrasound.  Patient did not do so due to incorrect billing. She has had multiple follow-up breast ultrasound in the past that showed dense breast tissue.  Continue annual screenings.  Normal breast exam today.  Screening for colon cancer -2018 colonoscopy.  Will repeat at GI's recommended interval.   Follow-up in 1 year for annual.     2019 [facility], 8:10 AM 05/13/2020

## 2020-05-13 NOTE — Patient Instructions (Signed)
Health Maintenance After Age 65 After age 66, you are at a higher risk for certain long-term diseases and infections as well as injuries from falls. Falls are a major cause of broken bones and head injuries in people who are older than age 66. Getting regular preventive care can help to keep you healthy and well. Preventive care includes getting regular testing and making lifestyle changes as recommended by your health care provider. Talk with your health care provider about:  Which screenings and tests you should have. A screening is a test that checks for a disease when you have no symptoms.  A diet and exercise plan that is right for you. What should I know about screenings and tests to prevent falls? Screening and testing are the best ways to find a health problem early. Early diagnosis and treatment give you the best chance of managing medical conditions that are common after age 66. Certain conditions and lifestyle choices may make you more likely to have a fall. Your health care provider may recommend:  Regular vision checks. Poor vision and conditions such as cataracts can make you more likely to have a fall. If you wear glasses, make sure to get your prescription updated if your vision changes.  Medicine review. Work with your health care provider to regularly review all of the medicines you are taking, including over-the-counter medicines. Ask your health care provider about any side effects that may make you more likely to have a fall. Tell your health care provider if any medicines that you take make you feel dizzy or sleepy.  Osteoporosis screening. Osteoporosis is a condition that causes the bones to get weaker. This can make the bones weak and cause them to break more easily.  Blood pressure screening. Blood pressure changes and medicines to control blood pressure can make you feel dizzy.  Strength and balance checks. Your health care provider may recommend certain tests to check your  strength and balance while standing, walking, or changing positions.  Foot health exam. Foot pain and numbness, as well as not wearing proper footwear, can make you more likely to have a fall.  Depression screening. You may be more likely to have a fall if you have a fear of falling, feel emotionally low, or feel unable to do activities that you used to do.  Alcohol use screening. Using too much alcohol can affect your balance and may make you more likely to have a fall. What actions can I take to lower my risk of falls? General instructions  Talk with your health care provider about your risks for falling. Tell your health care provider if: ? You fall. Be sure to tell your health care provider about all falls, even ones that seem minor. ? You feel dizzy, sleepy, or off-balance.  Take over-the-counter and prescription medicines only as told by your health care provider. These include any supplements.  Eat a healthy diet and maintain a healthy weight. A healthy diet includes low-fat dairy products, low-fat (lean) meats, and fiber from whole grains, beans, and lots of fruits and vegetables. Home safety  Remove any tripping hazards, such as rugs, cords, and clutter.  Install safety equipment such as grab bars in bathrooms and safety rails on stairs.  Keep rooms and walkways well-lit. Activity   Follow a regular exercise program to stay fit. This will help you maintain your balance. Ask your health care provider what types of exercise are appropriate for you.  If you need a cane or   walker, use it as recommended by your health care provider.  Wear supportive shoes that have nonskid soles. Lifestyle  Do not drink alcohol if your health care provider tells you not to drink.  If you drink alcohol, limit how much you have: ? 0-1 drink a day for women. ? 0-2 drinks a day for men.  Be aware of how much alcohol is in your drink. In the [place] one drink equals one typical bottle of beer (12  oz), one-half glass of wine (5 oz), or one shot of hard liquor (1 oz).  Do not use any products that contain nicotine or tobacco, such as cigarettes and e-cigarettes. If you need help quitting, ask your health care provider. Summary  Having a healthy lifestyle and getting preventive care can help to protect your health and wellness after age 65.  Screening and testing are the best way to find a health problem early and help you avoid having a fall. Early diagnosis and treatment give you the best chance for managing medical conditions that are more common for people who are older than age 66.  Falls are a major cause of broken bones and head injuries in people who are older than age 66. Take precautions to prevent a fall at home.  Work with your health care provider to learn what changes you can make to improve your health and wellness and to prevent falls. This information is not intended to replace advice given to you by your health care provider. Make sure you discuss any questions you have with your health care provider. Document Revised: 08/24/2018 Document Reviewed: 03/16/2017 Elsevier Patient Education  2020 [employer]  

## 2020-06-30 ENCOUNTER — Other Ambulatory Visit: Payer: Self-pay | Admitting: Nurse Practitioner

## 2020-06-30 DIAGNOSIS — E038 Other specified hypothyroidism: Secondary | ICD-10-CM

## 2020-07-01 NOTE — Telephone Encounter (Signed)
When I saw her at her annual in December she planned to get her lab work done with her PCP, so her TSH has not been checked in over a year. She can either come here and have that done or she can have it done at her PCP's office, whichever she prefers. I will send in a refill but this needs to be done prior to her next one. Thank you.

## 2020-07-01 NOTE — Telephone Encounter (Signed)
Medication refill request: Synthroid Last AEX:  05-13-20 TW Next AEX: 05-14-21  Last MMG (if hormonal medication request): n/a Refill authorized: Today, please advise.   Medication pended for #90, 2RF. Please refill if appropriate.

## 2020-07-03 NOTE — Telephone Encounter (Signed)
Call to patient. Patient states she plans to follow up with PCP for TSH level. RN advised would update Tiffany. Patient agreeable.   Routing to provider and will close encounter.

## 2020-09-23 ENCOUNTER — Other Ambulatory Visit: Payer: Self-pay | Admitting: Nurse Practitioner

## 2020-09-23 DIAGNOSIS — E038 Other specified hypothyroidism: Secondary | ICD-10-CM

## 2020-09-26 ENCOUNTER — Encounter: Payer: No Typology Code available for payment source | Admitting: Family

## 2020-10-08 ENCOUNTER — Encounter: Payer: No Typology Code available for payment source | Admitting: Family

## 2020-10-15 ENCOUNTER — Ambulatory Visit (INDEPENDENT_AMBULATORY_CARE_PROVIDER_SITE_OTHER): Payer: No Typology Code available for payment source | Admitting: Family

## 2020-10-15 ENCOUNTER — Other Ambulatory Visit: Payer: Self-pay

## 2020-10-15 ENCOUNTER — Encounter: Payer: Self-pay | Admitting: Family

## 2020-10-15 VITALS — BP 137/74 | HR 99 | Temp 98.3°F | Resp 16 | Wt 136.0 lb

## 2020-10-15 DIAGNOSIS — Z0000 Encounter for general adult medical examination without abnormal findings: Secondary | ICD-10-CM | POA: Diagnosis not present

## 2020-10-15 DIAGNOSIS — E039 Hypothyroidism, unspecified: Secondary | ICD-10-CM

## 2020-10-15 DIAGNOSIS — R739 Hyperglycemia, unspecified: Secondary | ICD-10-CM

## 2020-10-15 LAB — COMPREHENSIVE METABOLIC PANEL
ALT: 13 U/L (ref 0–35)
AST: 16 U/L (ref 0–37)
Albumin: 4.3 g/dL (ref 3.5–5.2)
Alkaline Phosphatase: 56 U/L (ref 39–117)
BUN: 13 mg/dL (ref 6–23)
CO2: 27 mEq/L (ref 19–32)
Calcium: 9.3 mg/dL (ref 8.4–10.5)
Chloride: 102 mEq/L (ref 96–112)
Creatinine, Ser: 0.77 mg/dL (ref 0.40–1.20)
GFR: 79.97 mL/min (ref >60.00)
Glucose, Bld: 95 mg/dL (ref 70–99)
Potassium: 4.4 mEq/L (ref 3.5–5.1)
Sodium: 138 mEq/L (ref 135–145)
Total Bilirubin: 0.6 mg/dL (ref 0.2–1.2)
Total Protein: 6.7 g/dL (ref 6.0–8.3)

## 2020-10-15 LAB — TSH: TSH: 1.43 u[IU]/mL (ref 0.35–4.50)

## 2020-10-15 NOTE — Progress Notes (Signed)
Subjective:   By signing my name below, I, Shehryar Baig, attest that this documentation has been prepared under the direction and in the presence of Sandford Craze NP. 10/15/2020      Patient ID: Christina Russo, female    DOB: Mar 07, 1956, 65 y.o.   MRN: [id number]  No chief complaint on file.   HPI Patient is in today for a comprehensive physical exam. She is doing well at this time. She denies having any cough, cold symptoms, constipation, diarrhea, blood in stool, blood in urine, swollen glands, skin rashes, moles, depression, or anxiety at this time. She has no family history of breast cancer. She has had no surgery in the past year. She has had no changes to her family medical history. She typically has one drink a day. She does not use drugs. She is not sexually active at this time. She is a former smoker but no longer smokes at this time.   Hypothyroid- She continues taking 88 mcg synthroid daily PO to manage her symptoms.  Immunizations: She has not taken the Covid-19 vaccine and is not willing to receive it at this time. She [place] UTD on tetanus and shingles vaccine. Colonoscopy: Last completed 04/15/2017. Results showed melanosis in the colon, otherwise the results are normal.  Dexa: Last completed 08/23/2019. Results showed osteoporosis. Repeat in 2 years.  Mammogram: Last completed 08/23/2019. Results showed possible distortion in the left breast.  Dental: She is UTD on dental care. Vision: She is due for a vision care appointment.   Past Medical History:  Diagnosis Date  . Heart murmur    "slight / intermittent"  . History of chicken pox   . Hypothyroidism   . OCD (obsessive compulsive disorder) 03/09/2017   Patient denies  . Osteopenia     Past Surgical History:  Procedure Laterality Date  . ABDOMINAL HYSTERECTOMY    . PARTIAL HYSTERECTOMY  2002    Family History  Problem Relation Age of Onset  . Diabetes Mother   . Hypertension Mother   . CVA Mother   .  Diabetes Father   . Congestive Heart Failure Father   . Hypertension Father   . Heart attack Father   . Glaucoma Sister   . Blindness Sister   . Heart attack Brother   . Stroke Maternal Grandmother   . Blindness Sister   . Blindness Brother   . Heart disease Brother   . Drug abuse Son   . Blindness Brother   . Colon cancer Neg Hx   . Esophageal cancer Neg Hx   . Pancreatic cancer Neg Hx   . Rectal cancer Neg Hx   . Stomach cancer Neg Hx     Social History   Socioeconomic History  . Marital status: Divorced    Spouse name: Not on file  . Number of children: Not on file  . Years of education: Not on file  . Highest education level: Not on file  Occupational History  . Not on file  Tobacco Use  . Smoking status: Former Games developer  . Smokeless tobacco: Never Used  Vaping Use  . Vaping Use: Never used  Substance and Sexual Activity  . Alcohol use: Yes    Alcohol/week: 7.0 standard drinks    Types: 7 Glasses of wine per week  . Drug use: No  . Sexual activity: Not Currently    Comment: INTERCOURSE AGE 76, SEXUAL PARTNERS LESS THAN 5  Other Topics Concern  . Not on file  Social History Narrative   Works in [employer] at [employer]   Divorced   3 children and 9 grandchildren and 1 great grandchild   Her children live locally   Enjoys reading, cooking, gardening, dance, music   Social Determinants of Health   Financial Resource Strain: Not on file  Food Insecurity: Not on file  Transportation Needs: Not on file  Physical Activity: Not on file  Stress: Not on file  Social Connections: Not on file  Intimate Partner Violence: Not on file    Outpatient Medications Prior to Visit  Medication Sig Dispense Refill  . Calcium Carbonate-Vitamin D 600-400 MG-UNIT chew tablet Chew 1 tablet by mouth 2 (two) times daily.    . Cholecalciferol (VITAMIN D3) 1000 units CAPS Take by mouth.    Tery Sanfilippo Calcium (STOOL SOFTENER PO) Take by mouth 3 times/day as needed-between  meals & bedtime.    Marland Kitchen estradiol (VIVELLE-DOT) 0.0375 MG/24HR Place 1 patch onto the skin 2 (two) times a week. 24 patch 4  . levothyroxine (SYNTHROID) 88 MCG tablet TAKE 1 TABLET BY MOUTH  DAILY BEFORE BREAKFAST 90 tablet 0  . Melatonin 3 MG TABS Take 2 tablets by mouth daily.    . NONFORMULARY OR COMPOUNDED ITEM VALERIAN SLEEP MED PRN    . NONFORMULARY OR COMPOUNDED ITEM CASCARA SAGRADA 1/2 TAB PO QD    . NONFORMULARY OR COMPOUNDED ITEM Take 1 capsule by mouth daily. Methylcobalamin 1mg  & MethylFolate 10mg . 90 each 3  . progesterone (PROMETRIUM) 100 MG capsule TAKE ONE CAPSULE BY MOUTH DAILY AT BEDTIME 90 capsule 3   No facility-administered medications prior to visit.    Allergies  Allergen Reactions  . Codeine Nausea And Vomiting  . Penicillins     Review of Systems  Constitutional:       (-)Swollen glands  HENT: Negative for congestion.   Respiratory: Negative for cough.   Gastrointestinal: Negative for blood in stool, constipation and diarrhea.  Genitourinary: Negative for dysuria, frequency and hematuria.  Skin: Negative for rash.       (-)Moles  Neurological: Negative for headaches.  Psychiatric/Behavioral: Negative for depression. The patient is not nervous/anxious.        Objective:    Physical Exam Constitutional:      General: She is not in acute distress.    Appearance: Normal appearance. She is not ill-appearing.  HENT:     Head: Normocephalic and atraumatic.     Right Ear: Tympanic membrane and external ear normal.     Left Ear: Tympanic membrane and external ear normal.  Eyes:     Extraocular Movements: Extraocular movements intact.     Pupils: Pupils are equal, round, and reactive to light.     Comments: No nystagmus  Cardiovascular:     Rate and Rhythm: Normal rate and regular rhythm.     Pulses: Normal pulses.     Heart sounds: Normal heart sounds. No murmur heard. No gallop.   Pulmonary:     Effort: Pulmonary effort is normal. No respiratory  distress.     Breath sounds: Normal breath sounds. No wheezing, rhonchi or rales.  Abdominal:     General: Bowel sounds are normal. There is no distension.     Palpations: Abdomen is soft. There is no mass.     Tenderness: There is no abdominal tenderness. There is no guarding or rebound.     Hernia: No hernia is present.  Musculoskeletal:     Comments: 5/5 strength in both upper  and lower extremities  Skin:    General: Skin is warm and dry.  Neurological:     Mental Status: She is alert and oriented to person, place, and time.     Deep Tendon Reflexes:     Reflex Scores:      Patellar reflexes are 2+ on the right side and 2+ on the left side. Psychiatric:        Behavior: Behavior normal.     There were no vitals taken for this visit. Wt Readings from Last 3 Encounters:  05/13/20 136 lb (61.7 kg)  05/08/19 129 lb (58.5 kg)  05/01/18 125 lb (56.7 kg)    Diabetic Foot Exam - Simple   No data filed    Lab Results  Component Value Date   WBC 6.0 05/08/2019   HGB 13.6 05/08/2019   HCT 39.9 05/08/2019   PLT 240 05/08/2019   GLUCOSE 108 (H) 05/08/2019   CHOL 178 05/08/2019   TRIG 57 05/08/2019   HDL 102 05/08/2019   LDLCALC 63 05/08/2019   ALT 14 05/08/2019   AST 16 05/08/2019   NA 138 05/08/2019   K 4.4 05/08/2019   CL 104 05/08/2019   CREATININE 0.77 05/08/2019   BUN 17 05/08/2019   CO2 21 05/08/2019   TSH 0.72 05/08/2019   HGBA1C 5.2 05/08/2019    Lab Results  Component Value Date   TSH 0.72 05/08/2019   Lab Results  Component Value Date   WBC 6.0 05/08/2019   HGB 13.6 05/08/2019   HCT 39.9 05/08/2019   MCV 94.3 05/08/2019   PLT 240 05/08/2019   Lab Results  Component Value Date   NA 138 05/08/2019   K 4.4 05/08/2019   CO2 21 05/08/2019   GLUCOSE 108 (H) 05/08/2019   BUN 17 05/08/2019   CREATININE 0.77 05/08/2019   BILITOT 0.4 05/08/2019   ALKPHOS 53 03/31/2018   AST 16 05/08/2019   ALT 14 05/08/2019   PROT 6.7 05/08/2019   ALBUMIN 4.3  03/31/2018   CALCIUM 9.3 05/08/2019   GFR 74.45 03/31/2018   Lab Results  Component Value Date   CHOL 178 05/08/2019   Lab Results  Component Value Date   HDL 102 05/08/2019   Lab Results  Component Value Date   LDLCALC 63 05/08/2019   Lab Results  Component Value Date   TRIG 57 05/08/2019   Lab Results  Component Value Date   CHOLHDL 1.7 05/08/2019   Lab Results  Component Value Date   HGBA1C 5.2 05/08/2019       Assessment & Plan:   Problem List Items Addressed This Visit   None      No orders of the defined types were placed in this encounter.   I, Sandford Craze NP, personally preformed the services described in this documentation.  All medical record entries made by the scribe were at my direction and in my presence.  I have reviewed the chart and discharge instructions (if applicable) and agree that the record reflects my personal performance and is accurate and complete. 10/15/2020   I,Shehryar Baig,acting as a Neurosurgeon for Lemont Fillers, NP.,have documented all relevant documentation on the behalf of Lemont Fillers, NP,as directed by  Lemont Fillers, NP while in the presence of Lemont Fillers, NP.   Shehryar [employer]

## 2020-10-15 NOTE — Assessment & Plan Note (Addendum)
Discussed healthy diet and exercise. Pt declines covid vaccine. Counseled on importance of vaccination against covid to decrease risk of death and severe illness. Patient is overdue for mammogram. She had a mammogram showing dense breast tissue and need for diagnostic imaging at [facility] previously. States that she always has dense tissue and declined diagnostic testing as this is not covered by her insurance.  She would like to return to [facility] for screening mammogram where they have old images on file. Order placed. Colonoscopy up to date. Dexa up to date.

## 2020-10-15 NOTE — Patient Instructions (Signed)
Please complete lab work prior to leaving.   

## 2020-10-15 NOTE — Assessment & Plan Note (Signed)
Clinically stable on synthroid 88 mcg. Check follow up TSH.

## 2020-11-03 ENCOUNTER — Other Ambulatory Visit: Payer: Self-pay

## 2020-11-03 ENCOUNTER — Encounter: Payer: Self-pay | Admitting: Family

## 2020-11-03 DIAGNOSIS — E038 Other specified hypothyroidism: Secondary | ICD-10-CM

## 2020-11-03 MED ORDER — LEVOTHYROXINE SODIUM 88 MCG PO TABS: 88.0000 ug | ORAL_TABLET | Freq: Every day | ORAL | 1 refills | Status: DC

## 2021-03-18 ENCOUNTER — Other Ambulatory Visit: Payer: Self-pay | Admitting: Family

## 2021-03-18 DIAGNOSIS — E038 Other specified hypothyroidism: Secondary | ICD-10-CM

## 2021-05-14 ENCOUNTER — Ambulatory Visit (INDEPENDENT_AMBULATORY_CARE_PROVIDER_SITE_OTHER): Payer: Medicare HMO | Admitting: Nurse Practitioner

## 2021-05-14 ENCOUNTER — Encounter: Payer: Self-pay | Admitting: Nurse Practitioner

## 2021-05-14 ENCOUNTER — Other Ambulatory Visit: Payer: Self-pay

## 2021-05-14 VITALS — BP 124/86 | HR 86 | Ht 61.0 in | Wt 133.0 lb

## 2021-05-14 DIAGNOSIS — M8589 Other specified disorders of bone density and structure, multiple sites: Secondary | ICD-10-CM

## 2021-05-14 DIAGNOSIS — Z780 Asymptomatic menopausal state: Secondary | ICD-10-CM

## 2021-05-14 DIAGNOSIS — Z79890 Hormone replacement therapy (postmenopausal): Secondary | ICD-10-CM | POA: Diagnosis not present

## 2021-05-14 DIAGNOSIS — Z01419 Encounter for gynecological examination (general) (routine) without abnormal findings: Secondary | ICD-10-CM

## 2021-05-14 MED ORDER — PROGESTERONE MICRONIZED 100 MG PO CAPS
ORAL_CAPSULE | ORAL | 3 refills | Status: DC
Start: 2021-05-14 — End: 2022-05-25

## 2021-05-14 MED ORDER — ESTRADIOL 0.5 MG PO TABS: 0.5000 mg | ORAL_TABLET | Freq: Every day | ORAL | 3 refills | Status: DC

## 2021-05-14 NOTE — Progress Notes (Signed)
Christina Russo [birth year] [id number]   History:  65 y.o. G3P0003 presents for breast and pelvic exam. Postmenopausal - on HRT. S/P 2002 TVH. Wants to switch from patch to pill due to costs. She requests Prometrium as she feels better while taking. She has tried to wean off HRT but does not tolerate. Normal pap history. Mammogram in 2021 showed left breast distortion but patient did not follow up due to issues with billing and being charged for diagnostic versus screening. History of hypothyroidism and osteoporosis - declines treatment, most recent showed osteopenia.   Gynecologic History No LMP recorded. Patient has had a hysterectomy.   Contraception: status post hysterectomy  Health maintenance Last Pap: No longer screening per guidelines Last mammogram: 08/23/2019. Results were: Left breast distortion (no follow up done by patient) Last colonoscopy: 04/15/2017. Results were: Normal, 10-year recall Last Dexa: 08/23/2019. Results were: T-score -2.3  Past medical history, past surgical history, family history and social history were all reviewed and documented in the EPIC chart. Works for medication management doing research. Watches her grandkids ages 26 and 8 on the weekends.   ROS:  A ROS was performed and pertinent positives and negatives are included.  Exam:  Vitals:   05/14/21 0834  BP: 124/86  Pulse: 86  SpO2: 98%  Weight: 133 lb (60.3 kg)  Height: 5\' 1"  (1.549 m)    Body mass index is 25.13 kg/m.  General appearance:  Normal Thyroid:  Symmetrical, normal in size, without palpable masses or nodularity. Respiratory  Auscultation:  Clear without wheezing or rhonchi Cardiovascular  Auscultation:  Regular rate, without rubs, murmurs or gallops  Edema/varicosities:  Not grossly evident Abdominal  Soft,nontender, without masses, guarding or rebound.  Liver/spleen:  No organomegaly noted  Hernia:  None appreciated  Skin  Inspection:  Grossly normal   Breasts: Examined  lying and sitting.   Right: Without masses, retractions, discharge or axillary adenopathy.   Left: Without masses, retractions, discharge or axillary adenopathy. Genitourinary   Inguinal/mons:  Normal without inguinal adenopathy  External genitalia:  Normal appearing vulva with no masses, tenderness, or lesions  BUS/Urethra/Skene's glands:  Normal  Vagina:  Normal appearing with normal color and discharge, no lesions  Cervix:  Absent  Uterus:  Absent  Adnexa/parametria:     Rt: Normal in size, without masses or tenderness.   Lt: Normal in size, without masses or tenderness.  Anus and perineum: Normal  Digital rectal exam: Normal sphincter tone without palpated masses or tenderness  Patient informed chaperone available to be present for breast and pelvic exam. Patient has requested no chaperone to be present. Patient has been advised what will be completed during breast and pelvic exam.   Assessment/Plan:  65 y.o. G3P0003 for breast and pelvic exam.   Well female exam with routine gynecological exam - Education provided on SBEs, importance of preventative screenings, current guidelines, high calcium diet, regular exercise, and multivitamin daily. Labs with PCP.   Postmenopausal - Plan: DG Bone Density. On HRT. S/P 2002 TVH.   Hormone replacement therapy (HRT) - Plan: estradiol (ESTRACE) 0.5 MG tablet daily, progesterone (PROMETRIUM) 100 MG capsule nightly. Has been doing wel on patches but due to costs wants to switch to pill form. She has tried to wean and did not tolerate. She is aware of risks of blood clots, heart attack, stroke, and breast cancer with continued use. She wants to continue. Refill x 1 year provided.   Osteopenia of multiple sites - Plan: DG Bone Density. History  of osteoporosis, decline treatment. Most recent DEXA showed T score -2.3.  She is taking daily vitamin D supplement and exercising regularly.  She will schedule this now.   Screening for cervical cancer -  Normal Pap history.  No longer screening per guidelines.  Screening for breast cancer - Most recent mammogram April 2021 showed distortion of left breast with recommendations to follow-up for ultrasound.  Patient did not do so due to incorrect billing. She has had multiple follow-up breast ultrasound in the past that showed dense breast tissue. She is overdue and aware of importance of preventative screenings. She plans to schedule now. Normal breast exam today.  Screening for colon cancer - 2018 colonoscopy.  Will repeat at 10-year interval per GI's recommendation.   Follow-up in 1 year for  medication follow up, 2 years for breast and pelvic exam.      Olivia Mackie [place], 9:25 AM 05/14/2021

## 2021-05-15 DIAGNOSIS — Z833 Family history of diabetes mellitus: Secondary | ICD-10-CM | POA: Diagnosis not present

## 2021-05-15 DIAGNOSIS — Z8249 Family history of ischemic heart disease and other diseases of the circulatory system: Secondary | ICD-10-CM | POA: Diagnosis not present

## 2021-05-15 DIAGNOSIS — E039 Hypothyroidism, unspecified: Secondary | ICD-10-CM | POA: Diagnosis not present

## 2021-05-15 DIAGNOSIS — Z823 Family history of stroke: Secondary | ICD-10-CM | POA: Diagnosis not present

## 2021-05-15 DIAGNOSIS — Z780 Asymptomatic menopausal state: Secondary | ICD-10-CM | POA: Diagnosis not present

## 2021-05-15 DIAGNOSIS — K5900 Constipation, unspecified: Secondary | ICD-10-CM | POA: Diagnosis not present

## 2021-05-15 DIAGNOSIS — Z87891 Personal history of nicotine dependence: Secondary | ICD-10-CM | POA: Diagnosis not present

## 2021-05-15 DIAGNOSIS — R69 Illness, unspecified: Secondary | ICD-10-CM | POA: Diagnosis not present

## 2021-06-25 ENCOUNTER — Other Ambulatory Visit: Payer: Self-pay

## 2021-06-25 ENCOUNTER — Encounter: Payer: Self-pay | Admitting: Obstetrics and Gynecology

## 2021-06-25 ENCOUNTER — Ambulatory Visit (INDEPENDENT_AMBULATORY_CARE_PROVIDER_SITE_OTHER): Payer: Medicare HMO | Admitting: Obstetrics and Gynecology

## 2021-06-25 VITALS — BP 134/95 | HR 84 | Ht 61.0 in | Wt 130.0 lb

## 2021-06-25 DIAGNOSIS — R350 Frequency of micturition: Secondary | ICD-10-CM

## 2021-06-25 DIAGNOSIS — N816 Rectocele: Secondary | ICD-10-CM

## 2021-06-25 DIAGNOSIS — N8110 Cystocele, unspecified: Secondary | ICD-10-CM

## 2021-06-25 DIAGNOSIS — N993 Prolapse of vaginal vault after hysterectomy: Secondary | ICD-10-CM

## 2021-06-25 LAB — POCT URINALYSIS DIPSTICK
Appearance: NORMAL
Bilirubin, UA: NEGATIVE
Blood, UA: NEGATIVE
Glucose, UA: NEGATIVE
Ketones, UA: NEGATIVE
Leukocytes, UA: NEGATIVE
Nitrite, UA: NEGATIVE
Protein, UA: NEGATIVE
Spec Grav, UA: 1.02 (ref 1.010–1.025)
Urobilinogen, UA: 0.2 E.U./dL
pH, UA: 7 (ref 5.0–8.0)

## 2021-06-25 NOTE — Progress Notes (Signed)
[facility] New Patient Evaluation and Consultation  Referring Provider: Debbrah Alar, NP PCP: Debbrah Alar, NP Date of Service: 06/25/2021  SUBJECTIVE Chief Complaint: New Patient (Initial Visit) Christina Russo is a 66 y.o. female here for a evaluation on a rectocele.)  History of Present Illness: Christina Russo is a 66 y.o. White or Caucasian female presenting for evaluation of prolapse.    Urinary Symptoms: Does not leak urine.   Day time voids 4-6.  Nocturia: 1-2 times per night to void. Voiding dysfunction: she empties her bladder well.  does not use a catheter to empty bladder.  When urinating, she feels she has no difficulties  UTIs:  0  UTI's in the last year.   Denies history of blood in urine and kidney or bladder stones  Pelvic Organ Prolapse Symptoms:                  She Admits to a feeling of a bulge the vaginal area. It has been present for 1 year, has been worsening over time.  She Admits to seeing a bulge.  This bulge is bothersome.  Bowel Symptom: Bowel movements: 1 time(s) per day Stool consistency: soft  Straining: no.  Splinting: no.  Incomplete evacuation: no.  She Denies accidental bowel leakage / fecal incontinence Bowel regimen: diet, fiber, and stool softener Last colonoscopy: Date 2018, Results negative  Sexual Function Sexually active: no.   Pelvic Pain Denies pelvic pain   Past Medical History:  Past Medical History:  Diagnosis Date   Heart murmur    "slight / intermittent"   History of chicken pox    Hypothyroidism    OCD (obsessive compulsive disorder) 03/09/2017   Patient denies   Osteopenia      Past Surgical History:   Past Surgical History:  Procedure Laterality Date   TUBAL LIGATION     VAGINAL HYSTERECTOMY  2002     Past OB/GYN History: OB History  Gravida Para Term Preterm AB Living  3       0 3  SAB IAB Ectopic Multiple Live Births      0   3    # Outcome Date GA Lbr Len/2nd  Weight Sex Delivery Anes PTL Lv  3 Gravida           2 Gravida           1 Gravida             Vaginal deliveries: 3 S/p hysterectomy 2002 for abnormal bleeding and abnormal paps.   Medications: She has a current medication list which includes the following prescription(s): calcium carbonate-vitamin d, vitamin d3, docusate calcium, estradiol, levothyroxine, melatonin, NONFORMULARY OR COMPOUNDED ITEM, and progesterone.   Allergies: Patient is allergic to codeine and penicillins.   Social History:  Social History   Tobacco Use   Smoking status: Former   Smokeless tobacco: Never  Scientific laboratory technician Use: Never used  Substance Use Topics   Alcohol use: Yes    Alcohol/week: 7.0 standard drinks    Types: 7 Glasses of wine per week   Drug use: No    She is employed as a Research officer, political party at [employer]. Regular exercise: Yes: yoga and weights  Family History:   Family History  Problem Relation Age of Onset   Diabetes Mother    Hypertension Mother    CVA Mother    Diabetes Father    Congestive Heart Failure Father    Hypertension Father  Heart attack Father    Glaucoma Sister    Blindness Sister    Heart attack Brother    Stroke Maternal Grandmother    Blindness Sister    Blindness Brother    Heart disease Brother    Drug abuse Son    Blindness Brother    Colon cancer Neg Hx    Esophageal cancer Neg Hx    Pancreatic cancer Neg Hx    Rectal cancer Neg Hx    Stomach cancer Neg Hx      Review of Systems: Review of Systems  Constitutional:  Negative for fever, malaise/fatigue and weight loss.  Respiratory:  Negative for cough, shortness of breath and wheezing.   Cardiovascular:  Negative for chest pain, palpitations and leg swelling.  Gastrointestinal:  Negative for abdominal pain and blood in stool.  Genitourinary:  Negative for dysuria.  Musculoskeletal:  Negative for myalgias.  Skin:  Negative for rash.  Neurological:  Negative for dizziness and  headaches.  Endo/Heme/Allergies:  Does not bruise/bleed easily.       + hot flashes  Psychiatric/Behavioral:  Negative for depression. The patient is not nervous/anxious.     OBJECTIVE Physical Exam: Vitals:   06/25/21 0835  BP: (!) 134/95  Pulse: 84  Weight: 130 lb (59 kg)  Height: 5\' 1"  (1.549 m)    Physical Exam Constitutional:      General: She is not in acute distress. Pulmonary:     Effort: Pulmonary effort is normal.  Abdominal:     General: There is no distension.     Palpations: Abdomen is soft.     Tenderness: There is no abdominal tenderness. There is no rebound.  Musculoskeletal:        General: No swelling. Normal range of motion.  Skin:    General: Skin is warm and dry.     Findings: No rash.  Neurological:     Mental Status: She is alert and oriented to person, place, and time.  Psychiatric:        Mood and Affect: Mood normal.        Behavior: Behavior normal.     GU / Detailed Urogynecologic Evaluation:  Pelvic Exam: Normal external female genitalia; Bartholin's and Skene's glands normal in appearance; urethral meatus normal in appearance, no urethral masses or discharge.   CST: negative  s/p hysterectomy: Speculum exam reveals normal vaginal mucosa with  atrophy and normal vaginal cuff.  Adnexa no mass, fullness, tenderness.    Pelvic floor strength II/V  Pelvic floor musculature: Right levator non-tender, Right obturator non-tender, Left levator non-tender, Left obturator non-tender  POP-Q:   POP-Q  -1.5                                            Aa   -1.5                                           Ba  -5                                              C   4.5  Gh  4                                            Pb  8                                            tvl   2                                            Ap  2                                            Bp                                                  D     Rectal Exam:  Normal external rectum  Post-Void Residual (PVR) by Bladder Scan: In order to evaluate bladder emptying, we discussed obtaining a postvoid residual and she agreed to this procedure.  Procedure: The ultrasound unit was placed on the patient's abdomen in the suprapubic region after the patient had voided. A PVR of 33 ml was obtained by bladder scan.  Laboratory Results: POC urine: negative   ASSESSMENT AND PLAN Ms. Desruisseaux is a 66 y.o. with:  1. Prolapse of posterior vaginal wall   2. Prolapse of anterior vaginal wall   3. Vaginal vault prolapse after hysterectomy   4. Urinary frequency    Stage II anterior, Stage III posterior, Stage I apical prolapse - For treatment of pelvic organ prolapse, we discussed options for management including expectant management, conservative management, and surgical management, such as Kegels, a pessary, pelvic floor physical therapy, and specific surgical procedures. - We discussed two options for prolapse repair:  1) vaginal repair without mesh - Pros - safer, no mesh complications - Cons - not as strong as mesh repair, higher risk of recurrence  2) laparoscopic repair with mesh - Pros - stronger, better long-term success - Cons - risks of mesh implant (erosion into vagina or bladder, adhering to the rectum, pain) - these risks are lower than with a vaginal mesh but still exist - She prefers the stronger surgery with mesh- robotic sacrocolpopexy. Handout provided - Will have her return for simple CMG to assess for occult incontinence and finalize surgery plan at that time.   Return for simple CMG test  Jaquita Folds, MD

## 2021-07-10 DIAGNOSIS — Z1231 Encounter for screening mammogram for malignant neoplasm of breast: Secondary | ICD-10-CM | POA: Diagnosis not present

## 2021-07-13 LAB — HM MAMMOGRAPHY

## 2021-07-16 NOTE — Progress Notes (Signed)
[facility] ?Return Visit ? ?SUBJECTIVE  ?History of Present Illness: ?Christina Russo is a 66 y.o. female seen in follow-up for simple CMG ? ? ?Past Medical History: ?Patient  has a past medical history of Heart murmur, History of chicken pox, Hypothyroidism, OCD (obsessive compulsive disorder) (03/09/2017), and Osteopenia.  ? ?Past Surgical History: ?She  has a past surgical history that includes Vaginal hysterectomy (2002) and Tubal ligation.  ? ?Medications: ?She has a current medication list which includes the following prescription(s): calcium carbonate-vitamin d, vitamin d3, docusate calcium, estradiol, levothyroxine, melatonin, NONFORMULARY OR COMPOUNDED ITEM, and progesterone.  ? ?Allergies: ?Patient is allergic to codeine and penicillins.  ? ?Social History: ?Patient  reports that she has quit smoking. She has never used smokeless tobacco. She reports current alcohol use of about 7.0 standard drinks per week. She reports that she does not use drugs.  ?  ?  ?OBJECTIVE  ?  ? ?Physical Exam: ?Vitals:  ? 07/17/21 0811  ?BP: 114/78  ?Pulse: 73  ? ?Gen: No apparent distress, A&O x 3. ? ?Detailed Urogynecologic Evaluation:  ?Deferred. Prior exam showed: ?POP-Q:  ?  ?POP-Q ?  ?-1.5  ?                                          Aa   ?-1.5 ?                                          Ba   ?-5  ?                                            C  ?  ?4.5  ?                                          Gh   ?4  ?                                          Pb   ?8  ?                                          tvl  ?  ?2  ?                                          Ap   ?2  ?                                          Bp   ?   ?  D  ?  ?   ?Verbal consent was obtained to perform simple CMG procedure:  ? ?Prolapse was reduced using 2 large cotton swabs. Urethra was prepped with betadine and a 69F catheter was placed and bladder was drained completely. The bladder was then backfilled  with sterile water by gravity.  ?First sensation: 180 ?First Desire: 43 ?Strong Desire: 270 ?Capacity: 325 ?Cough stress test was negative. Valsalva stress test was negative.  Performed in both the sitting and standing positions. Catheter was placed to drain the bladder.  ? ?Interpretation: ?CMG showed within normal limits sensation, and within normal limits cystometric capacity. Findings negative for stress incontinence, negative for detrusor overactivity.  ? ? ? ?ASSESSMENT AND PLAN  ?  ?Christina Russo is a 66 y.o. with:  ?1. Prolapse of posterior vaginal wall   ?2. Prolapse of anterior vaginal wall   ?3. Vaginal vault prolapse after hysterectomy   ? ? ? ?Plan for surgery: Exam under anesthesia, robotic sacrocolpopexy, possible posterior repair, cystoscopy ? ?- We reviewed the patient's specific anatomic and functional findings, with the assistance of diagrams, and together finalized the above procedure. The planned surgical procedures were discussed along with the surgical risks outlined below, which were also provided on a detailed handout. Additional treatment options including expectant management, conservative management, medical management were discussed where appropriate.  We reviewed the benefits and risks of each treatment option.  ? ?General Surgical Risks: ?For all procedures, there are risks of bleeding, infection, damage to surrounding organs including but not limited to bowel, bladder, blood vessels, ureters and nerves, and need for further surgery if an injury were to occur. These risks are all low with minimally invasive surgery.  ? ? ?- For preop Visit:  She is required to have a visit within 30 days of her surgery.  ? ? ?- Medical clearance: not required  ?- Anticoagulant use: No ?- Medicaid Hysterectomy form: No ?- Accepts blood transfusion: Yes ?- Expected length of stay: outpatient ? ?Request sent for surgery scheduling.  ? ?[name], MD ? ? ?

## 2021-07-17 ENCOUNTER — Other Ambulatory Visit: Payer: Self-pay

## 2021-07-17 ENCOUNTER — Encounter: Payer: Self-pay | Admitting: Obstetrics and Gynecology

## 2021-07-17 ENCOUNTER — Ambulatory Visit: Payer: Medicare HMO | Admitting: Obstetrics and Gynecology

## 2021-07-17 VITALS — BP 114/78 | HR 73

## 2021-07-17 DIAGNOSIS — N816 Rectocele: Secondary | ICD-10-CM

## 2021-07-17 DIAGNOSIS — N993 Prolapse of vaginal vault after hysterectomy: Secondary | ICD-10-CM

## 2021-07-17 DIAGNOSIS — N8110 Cystocele, unspecified: Secondary | ICD-10-CM | POA: Diagnosis not present

## 2021-07-22 ENCOUNTER — Telehealth: Payer: Self-pay | Admitting: Family

## 2021-07-22 DIAGNOSIS — E038 Other specified hypothyroidism: Secondary | ICD-10-CM

## 2021-07-22 NOTE — Telephone Encounter (Signed)
Medication:  ?levothyroxine (SYNTHROID) 88 MCG tablet [id number]  ?  ? ?Has the patient contacted their pharmacy? No. ?(If no, request that the patient contact the pharmacy for the refill.) ?(If yes, when and what did the pharmacy advise?) ? ?  ? ?Preferred Pharmacy (with phone number or street name):  ?[facility]  ?[address] [place], [place] [address] ?[phone] ?  ? ?Agent: Please be advised that RX refills may take up to 3 business days. We ask that you follow-up with your pharmacy. ? ?

## 2021-07-23 ENCOUNTER — Telehealth: Payer: Self-pay

## 2021-07-23 MED ORDER — LEVOTHYROXINE SODIUM 88 MCG PO TABS: 88.0000 ug | ORAL_TABLET | Freq: Every day | ORAL | 1 refills | Status: DC

## 2021-07-23 NOTE — Telephone Encounter (Signed)
Rx was sent  

## 2021-07-23 NOTE — Telephone Encounter (Signed)
Called patient to scheduled follow up,  per ov note in June she needed to return in 6 month.  ?Patient reports she has been coming once a year and wanted to verified if she needed to come back before June. Please advise.  ?

## 2021-07-23 NOTE — Telephone Encounter (Signed)
OK to wait until her physical in June.  ?

## 2021-07-23 NOTE — Addendum Note (Signed)
Addended by: Wilford Corner on: 07/23/2021 12:38 PM ? ? Modules accepted: Orders ? ?

## 2021-07-24 NOTE — Telephone Encounter (Signed)
Sent patient a message with this information (at her request to use mychart)  ?

## 2021-09-15 ENCOUNTER — Encounter: Payer: Self-pay | Admitting: Obstetrics and Gynecology

## 2021-09-15 ENCOUNTER — Ambulatory Visit (INDEPENDENT_AMBULATORY_CARE_PROVIDER_SITE_OTHER): Payer: Medicare HMO | Admitting: Obstetrics and Gynecology

## 2021-09-15 VITALS — BP 125/82 | HR 82

## 2021-09-15 DIAGNOSIS — N816 Rectocele: Secondary | ICD-10-CM | POA: Diagnosis not present

## 2021-09-15 DIAGNOSIS — N993 Prolapse of vaginal vault after hysterectomy: Secondary | ICD-10-CM | POA: Diagnosis not present

## 2021-09-15 DIAGNOSIS — Z01818 Encounter for other preprocedural examination: Secondary | ICD-10-CM

## 2021-09-15 DIAGNOSIS — N8110 Cystocele, unspecified: Secondary | ICD-10-CM | POA: Diagnosis not present

## 2021-09-15 MED ORDER — OXYCODONE HCL 5 MG PO TABS: 5.0000 mg | ORAL_TABLET | ORAL | 0 refills | Status: DC | PRN

## 2021-09-15 MED ORDER — ACETAMINOPHEN 500 MG PO TABS: 500.0000 mg | ORAL_TABLET | Freq: Four times a day (QID) | ORAL | 0 refills | Status: DC | PRN

## 2021-09-15 MED ORDER — POLYETHYLENE GLYCOL 3350 17 GM/SCOOP PO POWD: 17.0000 g | Freq: Every day | ORAL | 0 refills | Status: DC

## 2021-09-15 MED ORDER — IBUPROFEN 600 MG PO TABS: 600.0000 mg | ORAL_TABLET | Freq: Four times a day (QID) | ORAL | 0 refills | Status: DC | PRN

## 2021-09-15 NOTE — Progress Notes (Signed)
[facility] ?Pre-Operative evaluation ? ?Subjective ?Chief Complaint: Christina SalinesJudy E Russo presents for a preoperative encounter.  ? ?History of Present Illness: ?Christina Russo is a 66 y.o. female who presents for preoperative visit.  She is scheduled to undergo Exam under anesthesia, robotic sacrocolpopexy, possible posterior repair, cystoscopy on 09/30/21.  Her symptoms include vaginal bulge, and she was was found to have Stage II anterior, Stage III posterior, Stage I apical prolapse ? ?CMG showed within normal limits sensation, and within normal limits cystometric capacity. Findings negative for stress incontinence, negative for detrusor overactivity.   ? ?Past Medical History:  ?Diagnosis Date  ? Heart murmur   ? "slight / intermittent"  ? History of chicken pox   ? Hypothyroidism   ? OCD (obsessive compulsive disorder) 03/09/2017  ? Patient denies  ? Osteopenia   ?  ? ?Past Surgical History:  ?Procedure Laterality Date  ? TUBAL LIGATION    ? VAGINAL HYSTERECTOMY  2002  ? ? ?is allergic to codeine and penicillins.  ? ?Family History  ?Problem Relation Age of Onset  ? Diabetes Mother   ? Hypertension Mother   ? CVA Mother   ? Diabetes Father   ? Congestive Heart Failure Father   ? Hypertension Father   ? Heart attack Father   ? Glaucoma Sister   ? Blindness Sister   ? Heart attack Brother   ? Stroke Maternal Grandmother   ? Blindness Sister   ? Blindness Brother   ? Heart disease Brother   ? Drug abuse Son   ? Blindness Brother   ? Colon cancer Neg Hx   ? Esophageal cancer Neg Hx   ? Pancreatic cancer Neg Hx   ? Rectal cancer Neg Hx   ? Stomach cancer Neg Hx   ? ? ?Social History  ? ?Tobacco Use  ? Smoking status: Former  ? Smokeless tobacco: Never  ?Vaping Use  ? Vaping Use: Never used  ?Substance Use Topics  ? Alcohol use: Yes  ?  Alcohol/week: 7.0 standard drinks  ?  Types: 7 Glasses of wine per week  ? Drug use: No  ? ? ? ?Review of Systems was negative for a full 10 system review except as noted in  the History of Present Illness. ? ? ?Current Outpatient Medications:  ?  Calcium Carbonate-Vitamin D 600-400 MG-UNIT chew tablet, Chew 1 tablet by mouth 2 (two) times daily., Disp: , Rfl:  ?  Cholecalciferol (VITAMIN D3) 1000 units CAPS, Take by mouth., Disp: , Rfl:  ?  Docusate Calcium (STOOL SOFTENER PO), Take by mouth 3 times/day as needed-between meals & bedtime., Disp: , Rfl:  ?  estradiol (ESTRACE) 0.5 MG tablet, Take 1 tablet (0.5 mg total) by mouth daily., Disp: 90 tablet, Rfl: 3 ?  levothyroxine (SYNTHROID) 88 MCG tablet, Take 1 tablet (88 mcg total) by mouth daily before breakfast., Disp: 90 tablet, Rfl: 1 ?  Melatonin 3 MG TABS, Take 2 tablets by mouth daily., Disp: , Rfl:  ?  NONFORMULARY OR COMPOUNDED ITEM, CASCARA SAGRADA 1/2 TAB PO QD, Disp: , Rfl:  ?  progesterone (PROMETRIUM) 100 MG capsule, TAKE ONE CAPSULE BY MOUTH DAILY AT BEDTIME, Disp: 90 capsule, Rfl: 3  ? ?Objective ?Vitals:  ? 09/15/21 1134  ?BP: 125/82  ?Pulse: 82  ? ? ?Gen: NAD ?CV: S1 S2 RRR ?Lungs: Clear to auscultation bilaterally ?Abd: soft, nontender ? ?Previous Pelvic Exam showed: ?POP-Q:  ?  ?POP-Q ?  ?-1.5  ?  Aa   ?-1.5 ?                                          Ba   ?-5  ?                                            C  ?  ?4.5  ?                                          Gh   ?4  ?                                          Pb   ?8  ?                                          tvl  ?  ?2  ?                                          Ap   ?2  ?                                          Bp   ?   ?                                            D  ?  ? ? ? ?Assessment/ Plan ? ?Assessment: ?The patient is a 66 y.o. year old scheduled to undergo Exam under anesthesia, robotic sacrocolpopexy, possible posterior repair, cystoscopy. Verbal consent was obtained for these procedures. ? ?Plan: ?General Surgical Consent: ?The patient has previously been counseled on alternative treatments, and the decision by the  patient and provider was to proceed with the procedure listed above. ? ?For all procedures, there are risks of bleeding, infection, damage to surrounding organs including but not limited to bowel, bladder, blood vessels, ureters and nerves, and need for further surgery if an injury were to occur. These risks are all low with minimally invasive surgery.  ? ?There are risks of numbness and weakness at any body site or buttock/rectal pain.  It is possible that baseline pain can be worsened by surgery, either with or without mesh. If surgery is vaginal, there is also a low risk of possible conversion to laparoscopy or open abdominal incision where indicated. Very rare risks include blood transfusion, blood clot, heart attack, pneumonia, or death.  ? ?There is also a risk of short-term postoperative urinary retention with need to use a catheter. About half of patients need to go home from surgery with a catheter, which is then later removed  in the office. The risk of long-term need for a catheter is very low. There is also a risk of worsening of overactive bladder.  ? ? Prolapse (with or without mesh): ?Risk factors for surgical failure  include things that put pressure on your pelvis and the surgical repair, including obesity, chronic cough, and heavy lifting or straining (including lifting children or adults, straining on the toilet, or lifting heavy objects such as furniture or anything weighing >25 lbs. Risks of recurrence is 20-30% with vaginal native tissue repair and a less than 10% with sacrocolpopexy with mesh.   ? ?Sacrocolpopexy: ?Mesh implants may provide more prolapse support, but do have some unique risks to consider. It is important to understand that mesh is permanent and cannot be easily removed. Risks of abdominal sacrocolpopexy mesh include mesh exposure (~3-6%), painful intercourse (recent studies show lower rates after surgery compared to before, with ~5-8% risk of new onset), and very rare risks of  bowel or bladder injury or infection (<1%). The risk of mesh exposure is more likely in a woman with risks for poor healing (prior radiation, poorly controlled diabetes, or immunocompromised). The risk of new or worsened chronic pain after mesh implant is more common in women with baseline chronic pain and/or poorly controlled anxiety or depression. There is an FDA safety notification on vaginal mesh procedures for prolapse but NOT abdominal mesh procedures and therefore does not apply to your surgery. We have extensive experience and training with mesh placement and we have close postoperative follow up to identify any potential complications from mesh.   ? ?We discussed consent for blood products. Risks for blood transfusion include allergic reactions, other reactions that can affect different body organs and managed accordingly, transmission of infectious diseases such as HIV or Hepatitis. However, the blood is screened. Patient consents for blood products. ? ?Pre-operative instructions:  She was instructed to not take Aspirin/NSAIDs x 7days prior to surgery. Antibiotic prophylaxis was ordered as indicated. ? ?Cathter use: Patient will go home with foley if needed after post-operative voiding trial. ? ?Post-operative instructions:  She was provided with specific post-operative instructions, including precautions and signs/symptoms for which we would recommend contacting us, in addition to daytime and after-hours contact phone numbers. This was provided on a handout.  ? ?Post-operative medications: Prescriptions for motrin, tylenol, miralax, and oxycodone were sent to her pharmacy. Discussed using ibuprofen and tylenol on a schedule to limit use of narcotics.  ? ?Laboratory testing:  Type and screen ? ? ?Preoperative clearance:  She does not require surgical clearance.   ? ?Post-operative follow-up:  A post-operative appointment will be made for 6 weeks from the date of surgery. If she needs a post-operative nurse  visit for a voiding trial, that will be set up after she leaves the hospital.   ? ?Patient will call the clinic or use MyChart should anything change or any new issues arise. ? ? ?[name]

## 2021-09-16 NOTE — H&P (Signed)
[facility] ?Pre-Operative H&P ? ?Subjective ?Chief Complaint: Christina Russo presents for a preoperative encounter.  ? ?History of Present Illness: ?Christina Russo is a 66 y.o. female who presents for preoperative visit.  She is scheduled to undergo Exam under anesthesia, robotic sacrocolpopexy, possible posterior repair, cystoscopy on 09/30/21.  Her symptoms include vaginal bulge, and she was was found to have Stage II anterior, Stage III posterior, Stage I apical prolapse ? ?CMG showed within normal limits sensation, and within normal limits cystometric capacity. Findings negative for stress incontinence, negative for detrusor overactivity.   ? ?Past Medical History:  ?Diagnosis Date  ? Heart murmur   ? "slight / intermittent"  ? History of chicken pox   ? Hypothyroidism   ? OCD (obsessive compulsive disorder) 03/09/2017  ? Patient denies  ? Osteopenia   ?  ? ?Past Surgical History:  ?Procedure Laterality Date  ? TUBAL LIGATION    ? VAGINAL HYSTERECTOMY  2002  ? ? ?is allergic to codeine and penicillins.  ? ?Family History  ?Problem Relation Age of Onset  ? Diabetes Mother   ? Hypertension Mother   ? CVA Mother   ? Diabetes Father   ? Congestive Heart Failure Father   ? Hypertension Father   ? Heart attack Father   ? Glaucoma Sister   ? Blindness Sister   ? Heart attack Brother   ? Stroke Maternal Grandmother   ? Blindness Sister   ? Blindness Brother   ? Heart disease Brother   ? Drug abuse Son   ? Blindness Brother   ? Colon cancer Neg Hx   ? Esophageal cancer Neg Hx   ? Pancreatic cancer Neg Hx   ? Rectal cancer Neg Hx   ? Stomach cancer Neg Hx   ? ? ?Social History  ? ?Tobacco Use  ? Smoking status: Former  ? Smokeless tobacco: Never  ?Vaping Use  ? Vaping Use: Never used  ?Substance Use Topics  ? Alcohol use: Yes  ?  Alcohol/week: 7.0 standard drinks  ?  Types: 7 Glasses of wine per week  ? Drug use: No  ? ? ? ?Review of Systems was negative for a full 10 system review except as noted in the  History of Present Illness. ? ?No current facility-administered medications for this encounter. ? ?Current Outpatient Medications:  ?  acetaminophen (TYLENOL) 500 MG tablet, Take 1 tablet (500 mg total) by mouth every 6 (six) hours as needed (pain)., Disp: 30 tablet, Rfl: 0 ?  Calcium Carbonate-Vitamin D 600-400 MG-UNIT chew tablet, Chew 1 tablet by mouth 2 (two) times daily., Disp: , Rfl:  ?  Cholecalciferol (VITAMIN D3) 1000 units CAPS, Take by mouth., Disp: , Rfl:  ?  Docusate Calcium (STOOL SOFTENER PO), Take by mouth 3 times/day as needed-between meals & bedtime., Disp: , Rfl:  ?  estradiol (ESTRACE) 0.5 MG tablet, Take 1 tablet (0.5 mg total) by mouth daily., Disp: 90 tablet, Rfl: 3 ?  ibuprofen (ADVIL) 600 MG tablet, Take 1 tablet (600 mg total) by mouth every 6 (six) hours as needed., Disp: 30 tablet, Rfl: 0 ?  levothyroxine (SYNTHROID) 88 MCG tablet, Take 1 tablet (88 mcg total) by mouth daily before breakfast., Disp: 90 tablet, Rfl: 1 ?  Melatonin 3 MG TABS, Take 2 tablets by mouth daily., Disp: , Rfl:  ?  NONFORMULARY OR COMPOUNDED ITEM, CASCARA SAGRADA 1/2 TAB PO QD, Disp: , Rfl:  ?  oxyCODONE (OXY IR/ROXICODONE) 5 MG immediate release  tablet, Take 1 tablet (5 mg total) by mouth every 4 (four) hours as needed for severe pain., Disp: 15 tablet, Rfl: 0 ?  polyethylene glycol powder (GLYCOLAX/MIRALAX) 17 GM/SCOOP powder, Take 17 g by mouth daily. Drink 17g (1 scoop) dissolved in water per day., Disp: 255 g, Rfl: 0 ?  progesterone (PROMETRIUM) 100 MG capsule, TAKE ONE CAPSULE BY MOUTH DAILY AT BEDTIME, Disp: 90 capsule, Rfl: 3  ? ?Objective ?There were no vitals filed for this visit. ? ? ?Gen: NAD ?CV: S1 S2 RRR ?Lungs: Clear to auscultation bilaterally ?Abd: soft, nontender ? ?Previous Pelvic Exam showed: ?POP-Q:  ?  ?POP-Q ?  ?-1.5  ?                                          Aa   ?-1.5 ?                                          Ba   ?-5  ?                                            C  ?  ?4.5  ?                                           Gh   ?4  ?                                          Pb   ?8  ?                                          tvl  ?  ?2  ?                                          Ap   ?2  ?                                          Bp   ?   ?                                            D  ?  ? ? ? ?Assessment/ Plan ? ?Assessment: ?The patient is a 66 y.o. year old with stage III POP scheduled to undergo Exam under anesthesia, robotic sacrocolpopexy, possible posterior repair, cystoscopy.  ? ?[name], MD ? ? ? ?

## 2021-09-25 ENCOUNTER — Encounter (HOSPITAL_BASED_OUTPATIENT_CLINIC_OR_DEPARTMENT_OTHER): Payer: Self-pay | Admitting: Obstetrics and Gynecology

## 2021-09-25 ENCOUNTER — Other Ambulatory Visit: Payer: Self-pay

## 2021-09-25 NOTE — Progress Notes (Signed)
Spoke w/ via phone for pre-op interview--- pt ?Lab needs dos----  t&s, istat             ?Lab results------ no ?COVID test -----patient states asymptomatic no test needed ?Arrive at ------- 1000 on 09-30-2021 ?NPO after MN NO Solid Food.  Clear liquids from MN until--- 0900 ?Med rec completed ?Medications to take morning of surgery ----- synthroid ?Diabetic medication ----- n/a ?Patient instructed no nail polish to be worn day of surgery ?Patient instructed to bring photo id and insurance card day of surgery ?Patient aware to have Driver (ride ) / caregiver  for 24 hours after surgery --daughter, blyth ?Patient Special Instructions ----- reviewed [facility] and visitor guidelines ?Pre-Op special Istructions ----- n/a ?Patient verbalized understanding of instructions that were given at this phone interview. ?Patient denies shortness of breath, chest pain, fever, cough at this phone interview.  ?

## 2021-09-30 ENCOUNTER — Ambulatory Visit (HOSPITAL_BASED_OUTPATIENT_CLINIC_OR_DEPARTMENT_OTHER): Payer: Medicare HMO | Admitting: Anesthesiology

## 2021-09-30 ENCOUNTER — Encounter (HOSPITAL_BASED_OUTPATIENT_CLINIC_OR_DEPARTMENT_OTHER): Payer: Self-pay | Admitting: Obstetrics and Gynecology

## 2021-09-30 ENCOUNTER — Encounter (HOSPITAL_BASED_OUTPATIENT_CLINIC_OR_DEPARTMENT_OTHER)
Admission: RE | Disposition: A | Discharge status: Home / Self Care | Payer: Self-pay | Source: Home / Self Care | Attending: Obstetrics and Gynecology

## 2021-09-30 ENCOUNTER — Ambulatory Visit (HOSPITAL_BASED_OUTPATIENT_CLINIC_OR_DEPARTMENT_OTHER)
Admission: RE | Admit: 2021-09-30 | Discharge: 2021-09-30 | Disposition: A | Discharge status: Home / Self Care | Payer: Medicare HMO | Attending: Obstetrics and Gynecology | Admitting: Obstetrics and Gynecology

## 2021-09-30 ENCOUNTER — Other Ambulatory Visit: Payer: Self-pay

## 2021-09-30 DIAGNOSIS — Z01818 Encounter for other preprocedural examination: Secondary | ICD-10-CM

## 2021-09-30 DIAGNOSIS — Z87891 Personal history of nicotine dependence: Secondary | ICD-10-CM | POA: Diagnosis not present

## 2021-09-30 DIAGNOSIS — N993 Prolapse of vaginal vault after hysterectomy: Secondary | ICD-10-CM

## 2021-09-30 DIAGNOSIS — N819 Female genital prolapse, unspecified: Secondary | ICD-10-CM

## 2021-09-30 DIAGNOSIS — E039 Hypothyroidism, unspecified: Secondary | ICD-10-CM | POA: Diagnosis not present

## 2021-09-30 HISTORY — DX: Presence of spectacles and contact lenses: Z97.3

## 2021-09-30 HISTORY — PX: CYSTOSCOPY: SHX5120

## 2021-09-30 HISTORY — DX: Presence of dental prosthetic device (complete) (partial): Z97.2

## 2021-09-30 HISTORY — PX: ROBOTIC ASSISTED LAPAROSCOPIC SACROCOLPOPEXY: SHX5388

## 2021-09-30 HISTORY — DX: Cystocele, unspecified: N81.10

## 2021-09-30 LAB — POCT I-STAT, CHEM 8
BUN: 17 mg/dL (ref 8–23)
Calcium, Ion: 1.17 mmol/L (ref 1.15–1.40)
Chloride: 102 mmol/L (ref 98–111)
Creatinine, Ser: 0.8 mg/dL (ref 0.44–1.00)
Glucose, Bld: 107 mg/dL — ABNORMAL HIGH (ref 70–99)
HCT: 43 % (ref 36.0–46.0)
Hemoglobin: 14.6 g/dL (ref 12.0–15.0)
Potassium: 4.6 mmol/L (ref 3.5–5.1)
Sodium: 137 mmol/L (ref 135–145)
TCO2: 28 mmol/L (ref 22–32)

## 2021-09-30 LAB — TYPE AND SCREEN
ABO/RH(D): A POS
Antibody Screen: NEGATIVE

## 2021-09-30 LAB — ABO/RH: ABO/RH(D): A POS

## 2021-09-30 SURGERY — SACROCOLPOPEXY, ROBOT-ASSISTED, LAPAROSCOPIC
Anesthesia: General | Site: Vagina

## 2021-09-30 MED ORDER — OXYCODONE HCL 5 MG PO TABS: 5.0000 mg | ORAL_TABLET | ORAL | Status: DC | PRN

## 2021-09-30 MED ORDER — HYDROMORPHONE HCL 1 MG/ML IJ SOLN: 0.2500 mg | INTRAMUSCULAR | Status: DC | PRN

## 2021-09-30 MED ORDER — SODIUM CHLORIDE 0.9 % IR SOLN
Status: DC | PRN
  Administered 2021-09-30: 100 mL

## 2021-09-30 MED ORDER — ACETAMINOPHEN 325 MG PO TABS
ORAL_TABLET | ORAL | Status: AC
  Filled 2021-09-30: qty 2

## 2021-09-30 MED ORDER — PHENAZOPYRIDINE HCL 100 MG PO TABS
200.0000 mg | ORAL_TABLET | ORAL | Status: AC
  Administered 2021-09-30: 200 mg via ORAL

## 2021-09-30 MED ORDER — ROCURONIUM BROMIDE 100 MG/10ML IV SOLN
INTRAVENOUS | Status: DC | PRN
  Administered 2021-09-30 (×4): 10 mg via INTRAVENOUS
  Administered 2021-09-30: 50 mg via INTRAVENOUS
  Administered 2021-09-30: 10 mg via INTRAVENOUS

## 2021-09-30 MED ORDER — MIDAZOLAM HCL 2 MG/2ML IJ SOLN
INTRAMUSCULAR | Status: AC
Start: 2021-09-30 — End: ?
  Filled 2021-09-30: qty 2

## 2021-09-30 MED ORDER — SODIUM CHLORIDE 0.9 % IR SOLN
Status: DC | PRN
  Administered 2021-09-30: 150 mL via INTRAVESICAL

## 2021-09-30 MED ORDER — DOCUSATE SODIUM 100 MG PO CAPS
100.0000 mg | ORAL_CAPSULE | Freq: Two times a day (BID) | ORAL | Status: DC
  Administered 2021-09-30: 100 mg via ORAL

## 2021-09-30 MED ORDER — ONDANSETRON HCL 4 MG/2ML IJ SOLN: 4.0000 mg | Freq: Four times a day (QID) | INTRAMUSCULAR | Status: DC | PRN

## 2021-09-30 MED ORDER — SIMETHICONE 80 MG PO CHEW: 80.0000 mg | CHEWABLE_TABLET | Freq: Four times a day (QID) | ORAL | Status: DC | PRN

## 2021-09-30 MED ORDER — LACTATED RINGERS IV SOLN: INTRAVENOUS | Status: DC

## 2021-09-30 MED ORDER — ONDANSETRON HCL 4 MG/2ML IJ SOLN
INTRAMUSCULAR | Status: DC | PRN
  Administered 2021-09-30: 4 mg via INTRAVENOUS

## 2021-09-30 MED ORDER — MIDAZOLAM HCL 2 MG/2ML IJ SOLN
INTRAMUSCULAR | Status: DC | PRN
  Administered 2021-09-30: 1 mg via INTRAVENOUS

## 2021-09-30 MED ORDER — EPHEDRINE SULFATE (PRESSORS) 50 MG/ML IJ SOLN
INTRAMUSCULAR | Status: DC | PRN
  Administered 2021-09-30 (×2): 10 mg via INTRAVENOUS

## 2021-09-30 MED ORDER — BUPIVACAINE HCL (PF) 0.25 % IJ SOLN
INTRAMUSCULAR | Status: DC | PRN
  Administered 2021-09-30: 13 mL

## 2021-09-30 MED ORDER — FENTANYL CITRATE (PF) 100 MCG/2ML IJ SOLN
INTRAMUSCULAR | Status: AC
Start: 2021-09-30 — End: ?
  Filled 2021-09-30: qty 2

## 2021-09-30 MED ORDER — LIDOCAINE HCL (CARDIAC) PF 100 MG/5ML IV SOSY
PREFILLED_SYRINGE | INTRAVENOUS | Status: DC | PRN
Start: 2021-09-30 — End: 2021-09-30
  Administered 2021-09-30: 40 mg via INTRAVENOUS

## 2021-09-30 MED ORDER — GABAPENTIN 300 MG PO CAPS
300.0000 mg | ORAL_CAPSULE | ORAL | Status: AC
  Administered 2021-09-30: 300 mg via ORAL

## 2021-09-30 MED ORDER — METRONIDAZOLE 500 MG/100ML IV SOLN
INTRAVENOUS | Status: AC
  Filled 2021-09-30: qty 100

## 2021-09-30 MED ORDER — GENTAMICIN SULFATE 40 MG/ML IJ SOLN
300.0000 mg | INTRAVENOUS | Status: AC
  Administered 2021-09-30: 300 mg via INTRAVENOUS
  Filled 2021-09-30: qty 7.5

## 2021-09-30 MED ORDER — ACETAMINOPHEN 500 MG PO TABS
1000.0000 mg | ORAL_TABLET | ORAL | Status: AC
  Administered 2021-09-30: 1000 mg via ORAL

## 2021-09-30 MED ORDER — ACETAMINOPHEN 500 MG PO TABS
ORAL_TABLET | ORAL | Status: AC
  Filled 2021-09-30: qty 2

## 2021-09-30 MED ORDER — ACETAMINOPHEN 325 MG PO TABS
650.0000 mg | ORAL_TABLET | ORAL | Status: DC | PRN
  Administered 2021-09-30: 650 mg via ORAL

## 2021-09-30 MED ORDER — WATER FOR IRRIGATION, STERILE IR SOLN
Status: DC | PRN
  Administered 2021-09-30: 500 mL via SURGICAL_CAVITY

## 2021-09-30 MED ORDER — DEXAMETHASONE SODIUM PHOSPHATE 4 MG/ML IJ SOLN
INTRAMUSCULAR | Status: DC | PRN
  Administered 2021-09-30: 8 mg via INTRAVENOUS

## 2021-09-30 MED ORDER — GABAPENTIN 300 MG PO CAPS
ORAL_CAPSULE | ORAL | Status: AC
  Filled 2021-09-30: qty 1

## 2021-09-30 MED ORDER — HEMOSTATIC AGENTS (NO CHARGE) OPTIME
TOPICAL | Status: DC | PRN
  Administered 2021-09-30: 1 via TOPICAL

## 2021-09-30 MED ORDER — PHENAZOPYRIDINE HCL 100 MG PO TABS
ORAL_TABLET | ORAL | Status: AC
  Filled 2021-09-30: qty 2

## 2021-09-30 MED ORDER — KETOROLAC TROMETHAMINE 30 MG/ML IJ SOLN
INTRAMUSCULAR | Status: DC | PRN
Start: 2021-09-30 — End: 2021-09-30
  Administered 2021-09-30: 30 mg via INTRAVENOUS

## 2021-09-30 MED ORDER — PROPOFOL 10 MG/ML IV BOLUS
INTRAVENOUS | Status: DC | PRN
  Administered 2021-09-30: 120 mg via INTRAVENOUS

## 2021-09-30 MED ORDER — ONDANSETRON HCL 4 MG PO TABS: 4.0000 mg | ORAL_TABLET | Freq: Four times a day (QID) | ORAL | Status: DC | PRN

## 2021-09-30 MED ORDER — SUGAMMADEX SODIUM 200 MG/2ML IV SOLN
INTRAVENOUS | Status: DC | PRN
  Administered 2021-09-30: 200 mg via INTRAVENOUS

## 2021-09-30 MED ORDER — DROPERIDOL 2.5 MG/ML IJ SOLN: 0.6250 mg | Freq: Once | INTRAMUSCULAR | Status: DC | PRN

## 2021-09-30 MED ORDER — KETOROLAC TROMETHAMINE 30 MG/ML IJ SOLN: 30.0000 mg | Freq: Once | INTRAMUSCULAR | Status: DC

## 2021-09-30 MED ORDER — METRONIDAZOLE 500 MG/100ML IV SOLN
500.0000 mg | INTRAVENOUS | Status: AC
  Administered 2021-09-30: 500 mg via INTRAVENOUS

## 2021-09-30 MED ORDER — FENTANYL CITRATE (PF) 100 MCG/2ML IJ SOLN
INTRAMUSCULAR | Status: AC
  Filled 2021-09-30: qty 2

## 2021-09-30 MED ORDER — DOCUSATE SODIUM 100 MG PO CAPS
ORAL_CAPSULE | ORAL | Status: AC
  Filled 2021-09-30: qty 1

## 2021-09-30 MED ORDER — FENTANYL CITRATE (PF) 100 MCG/2ML IJ SOLN
INTRAMUSCULAR | Status: DC | PRN
  Administered 2021-09-30 (×2): 25 ug via INTRAVENOUS
  Administered 2021-09-30: 50 ug via INTRAVENOUS
  Administered 2021-09-30: 25 ug via INTRAVENOUS
  Administered 2021-09-30: 50 ug via INTRAVENOUS

## 2021-09-30 MED ORDER — POVIDONE-IODINE 10 % EX SWAB: 2.0000 "application " | Freq: Once | CUTANEOUS | Status: DC

## 2021-09-30 SURGICAL SUPPLY — 96 items
ADH SKN CLS APL DERMABOND .7 (GAUZE/BANDAGES/DRESSINGS) ×4
AGENT HMST KT MTR STRL THRMB (HEMOSTASIS)
APL PRP STRL LF DISP 70% ISPRP (MISCELLANEOUS) ×4
APL SRG 38 LTWT LNG FL B (MISCELLANEOUS) ×4
APL SWBSTK 6 STRL LF DISP (MISCELLANEOUS) ×4
APPLICATOR ARISTA FLEXITIP XL (MISCELLANEOUS) ×2 IMPLANT
APPLICATOR COTTON TIP 6 STRL (MISCELLANEOUS) ×1 IMPLANT
APPLICATOR COTTON TIP 6IN STRL (MISCELLANEOUS) ×5
BLADE CLIPPER SENSICLIP SURGIC (BLADE) ×5 IMPLANT
BLADE SURG 15 STRL LF DISP TIS (BLADE) ×4 IMPLANT
BLADE SURG 15 STRL SS (BLADE) ×5
CATH FOLEY 3WAY  5CC 16FR (CATHETERS) ×5
CATH FOLEY 3WAY 5CC 16FR (CATHETERS) ×4 IMPLANT
CHLORAPREP W/TINT 26 (MISCELLANEOUS) ×5 IMPLANT
COVER BACK TABLE 60X90IN (DRAPES) ×5 IMPLANT
COVER TIP SHEARS 8 DVNC (MISCELLANEOUS) ×4 IMPLANT
COVER TIP SHEARS 8MM DA VINCI (MISCELLANEOUS) ×5
DECANTER SPIKE VIAL GLASS SM (MISCELLANEOUS) ×6 IMPLANT
DEFOGGER SCOPE WARMER CLEARIFY (MISCELLANEOUS) ×5 IMPLANT
DERMABOND ADVANCED (GAUZE/BANDAGES/DRESSINGS) ×1
DERMABOND ADVANCED .7 DNX12 (GAUZE/BANDAGES/DRESSINGS) ×4 IMPLANT
DEVICE CAPIO SLIM SINGLE (INSTRUMENTS) IMPLANT
DRAPE ARM DVNC X/XI (DISPOSABLE) ×16 IMPLANT
DRAPE COLUMN DVNC XI (DISPOSABLE) ×4 IMPLANT
DRAPE DA VINCI XI ARM (DISPOSABLE) ×20
DRAPE DA VINCI XI COLUMN (DISPOSABLE) ×5
DRAPE SHEET LG 3/4 BI-LAMINATE (DRAPES) ×5 IMPLANT
DRAPE SURG IRRIG POUCH 19X23 (DRAPES) ×2 IMPLANT
DRAPE UTILITY XL STRL (DRAPES) ×5 IMPLANT
ELECT REM PT RETURN 9FT ADLT (ELECTROSURGICAL) ×5
ELECTRODE REM PT RTRN 9FT ADLT (ELECTROSURGICAL) ×4 IMPLANT
GAUZE 4X4 16PLY ~~LOC~~+RFID DBL (SPONGE) ×10 IMPLANT
GLOVE BIO SURGEON STRL SZ 6 (GLOVE) ×15 IMPLANT
GLOVE BIOGEL PI IND STRL 6.5 (GLOVE) ×16 IMPLANT
GLOVE BIOGEL PI IND STRL 7.0 (GLOVE) ×8 IMPLANT
GLOVE BIOGEL PI INDICATOR 6.5 (GLOVE) ×4
GLOVE BIOGEL PI INDICATOR 7.0 (GLOVE) ×2
GOWN STRL REUS W/TWL LRG LVL3 (GOWN DISPOSABLE) ×5 IMPLANT
HEMOSTAT ARISTA ABSORB 3G PWDR (HEMOSTASIS) ×2 IMPLANT
HIBICLENS CHG 4% 4OZ BTL (MISCELLANEOUS) ×5 IMPLANT
HOLDER FOLEY CATH W/STRAP (MISCELLANEOUS) ×5 IMPLANT
IRRIG SUCT STRYKERFLOW 2 WTIP (MISCELLANEOUS) ×5
IRRIGATION SUCT STRKRFLW 2 WTP (MISCELLANEOUS) ×4 IMPLANT
IV NS 1000ML (IV SOLUTION) ×10
IV NS 1000ML BAXH (IV SOLUTION) ×2 IMPLANT
KIT TURNOVER CYSTO (KITS) ×5 IMPLANT
LEGGING LITHOTOMY PAIR STRL (DRAPES) ×5 IMPLANT
MANIFOLD NEPTUNE II (INSTRUMENTS) ×5 IMPLANT
MANIPULATOR ADVINCU DEL 2.5 PL (MISCELLANEOUS) IMPLANT
MANIPULATOR ADVINCU DEL 3.0 PL (MISCELLANEOUS) IMPLANT
MANIPULATOR ADVINCU DEL 3.5 PL (MISCELLANEOUS) IMPLANT
MANIPULATOR ADVINCU DEL 4.0 PL (MISCELLANEOUS) IMPLANT
MESH VERTESSA LITE -Y 2X4X3 (Mesh General) ×5 IMPLANT
NDL MAYO 6 CRC TAPER PT (NEEDLE) IMPLANT
NEEDLE HYPO 22GX1.5 SAFETY (NEEDLE) ×5 IMPLANT
NEEDLE INSUFFLATION 120MM (ENDOMECHANICALS) ×5 IMPLANT
NEEDLE MAYO 6 CRC TAPER PT (NEEDLE) IMPLANT
NS IRRIG 1000ML POUR BTL (IV SOLUTION) ×3 IMPLANT
OBTURATOR OPTICAL STANDARD 8MM (TROCAR) ×5
OBTURATOR OPTICAL STND 8 DVNC (TROCAR) ×4
OBTURATOR OPTICALSTD 8 DVNC (TROCAR) ×4 IMPLANT
PACK CYSTO (CUSTOM PROCEDURE TRAY) ×3 IMPLANT
PACK ROBOT WH (CUSTOM PROCEDURE TRAY) ×5 IMPLANT
PACK ROBOTIC GOWN (GOWN DISPOSABLE) ×5 IMPLANT
PACK VAGINAL WOMENS (CUSTOM PROCEDURE TRAY) ×5 IMPLANT
PAD OB MATERNITY 4.3X12.25 (PERSONAL CARE ITEMS) ×5 IMPLANT
PAD POSITIONING PINK XL (MISCELLANEOUS) ×5 IMPLANT
PAD PREP 24X48 CUFFED NSTRL (MISCELLANEOUS) ×5 IMPLANT
POUCH LAPAROSCOPIC INSTRUMENT (MISCELLANEOUS) IMPLANT
PROTECTOR NERVE ULNAR (MISCELLANEOUS) ×3 IMPLANT
RETRACTOR LONE STAR DISPOSABLE (INSTRUMENTS) ×5 IMPLANT
RETRACTOR STAY HOOK 5MM (MISCELLANEOUS) ×5 IMPLANT
SEAL CANN UNIV 5-8 DVNC XI (MISCELLANEOUS) ×20 IMPLANT
SEAL XI 5MM-8MM UNIVERSAL (MISCELLANEOUS) ×25
SET IRRIG Y TYPE TUR BLADDER L (SET/KITS/TRAYS/PACK) ×5 IMPLANT
SET TUBE SMOKE EVAC HIGH FLOW (TUBING) ×5 IMPLANT
SPONGE T-LAP 4X18 ~~LOC~~+RFID (SPONGE) IMPLANT
SURGIFLO W/THROMBIN 8M KIT (HEMOSTASIS) IMPLANT
SUT ABS MONO DBL WITH NDL 48IN (SUTURE) IMPLANT
SUT GORETEX NAB #0 THX26 36IN (SUTURE) ×5 IMPLANT
SUT MNCRL AB 4-0 PS2 18 (SUTURE) ×5 IMPLANT
SUT MON AB 2-0 SH 27 (SUTURE) ×5 IMPLANT
SUT VIC AB 0 CT1 27 (SUTURE)
SUT VIC AB 0 CT1 27XBRD ANTBC (SUTURE) IMPLANT
SUT VIC AB 2-0 SH 27 (SUTURE)
SUT VIC AB 2-0 SH 27XBRD (SUTURE) IMPLANT
SUT VICRYL 2-0 SH 8X27 (SUTURE) ×3 IMPLANT
SUT VLOC 180 0 9IN  GS21 (SUTURE)
SUT VLOC 180 0 9IN GS21 (SUTURE) IMPLANT
SUT VLOC 180 2-0 9IN GS21 (SUTURE) ×10 IMPLANT
SYR BULB EAR ULCER 3OZ GRN STR (SYRINGE) ×3 IMPLANT
SYR TOOMEY IRRIG 70ML (MISCELLANEOUS) ×5
SYRINGE TOOMEY IRRIG 70ML (MISCELLANEOUS) ×1 IMPLANT
TOWEL OR 17X26 10 PK STRL BLUE (TOWEL DISPOSABLE) ×5 IMPLANT
TRAY FOLEY W/BAG SLVR 14FR LF (SET/KITS/TRAYS/PACK) ×3 IMPLANT
WATER STERILE IRR 500ML POUR (IV SOLUTION) ×2 IMPLANT

## 2021-09-30 NOTE — Transfer of Care (Signed)
Immediate Anesthesia Transfer of Care Note ? ?Patient: Christina Russo ? ?Procedure(s) Performed: XI ROBOTIC ASSISTED LAPAROSCOPIC SACROCOLPOPEXY (Abdomen) ?CYSTOSCOPY (Bladder) ?EXAM UNDER ANESTHESIA (Vagina ) ? ?Patient Location: PACU ? ?Anesthesia Type:General ? ?Level of Consciousness: awake, alert  and oriented ? ?Airway & Oxygen Therapy: Patient Spontanous Breathing ? ?Post-op Assessment: Report given to RN and Post -op Vital signs reviewed and stable ? ?Post vital signs: Reviewed and stable ? ?Last Vitals:  ?Vitals Value Taken Time  ?BP 129/78 09/30/21 1537  ?Temp    ?Pulse 87 09/30/21 1542  ?Resp 14 09/30/21 1542  ?SpO2 97 % 09/30/21 1542  ?Vitals shown include unvalidated device data. ? ?Last Pain:  ?Vitals:  ? 09/30/21 1017  ?TempSrc: Oral  ?PainSc: 0-No pain  ?   ? ?Patients Stated Pain Goal: 4 (09/30/21 1017) ? ?Complications: No notable events documented. ?

## 2021-09-30 NOTE — Anesthesia Preprocedure Evaluation (Addendum)
Anesthesia Evaluation  ?Patient identified by MRN, date of birth, ID band ?Patient awake ? ? ? ?Reviewed: ?Allergy & Precautions, NPO status , Patient's Chart, lab work & pertinent test results ? ?History of Anesthesia Complications ?Negative for: history of anesthetic complications ? ?Airway ?Mallampati: II ? ?TM Distance: >3 FB ?Neck ROM: Full ? ? ? Dental ? ?(+) Partial Upper ?  ?Pulmonary ?former smoker,  ?  ?Pulmonary exam normal ? ? ? ? ? ? ? Cardiovascular ?negative cardio ROS ?Normal cardiovascular exam ? ? ?  ?Neuro/Psych ?negative neurological ROS ? negative psych ROS  ? GI/Hepatic ?negative GI ROS, Neg liver ROS,   ?Endo/Other  ?Hypothyroidism  ? Renal/GU ?negative Renal ROS  ?negative genitourinary ?  ?Musculoskeletal ?negative musculoskeletal ROS ?(+)  ? Abdominal ?  ?Peds ? Hematology ?negative hematology ROS ?(+)   ?Anesthesia Other Findings ?Day of surgery medications reviewed with patient. ? Reproductive/Obstetrics ?vaginal vault prolapse after hysterectomy; prolapse of posterior vagina ? ?  ? ? ? ? ? ? ? ? ? ? ? ? ? ?  ?  ? ? ? ? ? ? ? ?Anesthesia Physical ?Anesthesia Plan ? ?ASA: 2 ? ?Anesthesia Plan: General  ? ?Post-op Pain Management: Tylenol PO (pre-op)*  ? ?Induction:  ? ?PONV Risk Score and Plan: 4 or greater and Treatment may vary due to age or medical condition, Ondansetron, Dexamethasone and Midazolam ? ?Airway Management Planned: Oral ETT ? ?Additional Equipment: None ? ?Intra-op Plan:  ? ?Post-operative Plan: Extubation in OR ? ?Informed Consent: I have reviewed the patients History and Physical, chart, labs and discussed the procedure including the risks, benefits and alternatives for the proposed anesthesia with the patient or authorized representative who has indicated his/her understanding and acceptance.  ? ? ? ?Dental advisory given ? ?Plan Discussed with: CRNA ? ?Anesthesia Plan Comments:   ? ? ? ? ? ?Anesthesia Quick Evaluation ? ?

## 2021-09-30 NOTE — Op Note (Addendum)
Operative Note ? ?Preoperative Diagnosis: anterior vaginal prolapse, posterior vaginal prolapse, and vaginal vault prolapse after hysterectomy ? ?Postoperative Diagnosis: same ? ?Procedures performed:  ?Robotic assisted laparoscopic Sacrocolpopexy Lorna Few Lite Y), cystoscopy ? ?Implants:  ?Implant Name Type Inv. Item Serial No. Manufacturer Lot No. LRB No. Used Action  ?MESH Grayland Ormond [id number] - [id number] Mesh General MESH Chesley Mires -Y [phone]  [facility] [phone] N/A 1 Implanted  ? ? ?Attending Surgeon: Lanetta Inch, MD ? ?Anesthesia: General endotracheal ? ?Findings: 1. On vaginal exam, stage III vaginal prolapse was noted ? 2. On cystoscopy, normal bladder and urethra without injury or lesion. Brisk bilateral ureteral efflux noted.   ? ?Specimens: * No specimens in log * ? ?Estimated blood loss: 25 mL ? ?IV fluids: 1000 mL ? ?Urine output: 175 mL ? ?Complications: none ? ?Procedure in Detail: ? ?After informed consent was obtained, the patient was taken to the operating room, where general anesthesia was induced and found to be adequate. She was placed in dorsolithotomy position in yellowfin stirrups. Her hips were noted not to be hyperflexed or hyperextended. Her arms were padded with gel pads and tucked to her sides. Her hands were surrounded by foam. A padded strap was placed across her chest with foam between the pad and her skin. She was noted to be appropriately positioned with all pressure points well padded and off tension. A tilt test showed no slippage. She was prepped and draped in the usual sterile fashion.  A sterile Foley catheter was inserted.  ? ?0.25% plain Marcaine was injected into the umbilicus and an incision was made with a scalpel. A Veress needle was inserted into the incision, CO2 insufflation was started, a low opening pressure was noted, and pneumoperitoneum was obtained. The Veress needle was removed and a 49mm robotic trocar was placed. Entry into the peritoneal cavity  was confirmed. . After determining placement for the other ports, Local anesthetic was injected at each site and two 8 mm incisions were made for robotic ports at 10 cm lateral to and at the level of the umbilical port. Two additional 8 mm incisions were made 10 cm lateral to these followed by 8 mm robotic ports - the right side for an assistant port. All trocars were placed sequentially under direct visualization of the camera. The patient was placed in Trendelenburg. The sacrum appeared to be free of any adhesive disease.The robot was docked on the patient's right side. Monopolar endoshears were placed in the right arm, a [place] bipolar grasper was placed in the 2nd arm of the patient's left side, and a Tip up grasper was placed in the 3rd arm on the patient's left side.  ?  ?With a lucite probe in the vagina, anterior vaginal dissection was then performed with sharp dissection and electrosurgery to separate the vesicovaginal space to the level of the urethra. The posterior vaginal dissection was then performed with sharp dissection and electrosurgery in order to dissect the rectum away from the posterior vagina. Attention was then turned to the sacral promontory. The peritoneum overlying the sacral promontory was tented up, dissected sharply with monopolar scissors and electrosurgery using layer by layer technique. The overlying areolar and adipose tissue were taken down until the anterior longitudinal sacral ligament was identified. The peritoneal incision was extended down to the posterior cul-de-sac. This was performed with care to avoid the ureter on the right side and the sigmoid colon and its mesentary on the left side.   Small vessels were  cauterized along the way to obtain excellent hemostasis. A "Y" mesh was then inserted into the abdomen after trimming to appropriate size. With the probe in the vagina, the anterior leaf of the Y mesh was affixed to the anterior portion of the vagina using a 2-0 v-loc  suture in a spiral pattern to distribute the suture evenly across the surface of the anterior mesh leaf. In a similar fashion, the posterior leaf of the Y mesh was attached to the posterior surface of the vagina with 2-0 v-loc suture. Arista was placed in the posterior cul de sac and good hemostasis was noted.  The distal end of the mesh was then brought to overlie the sacrum. The correct amount of tension was determined in order to elevate the vagina, but not put the mesh under tension. The distal end of the mesh was then affixed to the anterior longitudinal sacral ligament using two interrupted transverse stitches of CV2 Gortex. The excess distal mesh was then cut and removed. The peritoneum was reapproximated over the mesh using 2-0 monocryl. The bladder flap was incorporated to completely retroperitonealize the mesh. All pedicles were carefully inspected and noted to be hemostatic as the CO2 gas was deflated. All instruments were removed from the patient's abdomen.  ?  ?The Foley catheter was removed.  A 70-degree cystoscope was introduced, and 360-degree inspection revealed no injury, lesion or foreign body in the bladder. Brisk bilateral ureteral efflux was noted with the assistance of pyridium.  The bladder was drained and the cystoscope was removed.  The Foley catheter was replaced. ?  ?The robot was undocked. The CO2 gas was removed and the ports were removed.  The skin incisions were closed with subcutaneous stitches of 4-0 Monocryl and covered with skin glue.  ?  ?The patient tolerated the procedure well. Sponge, lap, and needle counts were correct x 2. She was awakened from anesthesia and transferred to the recovery room in stable condition.  ? ?

## 2021-09-30 NOTE — Interval H&P Note (Signed)
History and Physical Interval Note: ? ?09/30/2021 ?11:56 AM ? ?Christina Russo  has presented today for surgery, with the diagnosis of vaginal vault prolapse after hysterectomy; prolapse of posterior vagina.  The various methods of treatment have been discussed with the patient and family. After consideration of risks, benefits and other options for treatment, the patient has consented to  Procedure(s) with comments: ?XI ROBOTIC ASSISTED LAPAROSCOPIC SACROCOLPOPEXY (N/A) ?POSTERIOR REPAIR (RECTOCELE) (possible) (N/A) ?CYSTOSCOPY (N/A) as a surgical intervention.  The patient's history has been reviewed, patient examined, no change in status, stable for surgery.  I have reviewed the patient's chart and labs.  Questions were answered to the patient's satisfaction.   ? ? ?[name] ? ? ?

## 2021-09-30 NOTE — Discharge Instructions (Addendum)

## 2021-09-30 NOTE — Anesthesia Procedure Notes (Signed)
Procedure Name: Intubation ?Date/Time: 09/30/2021 12:40 PM ?Performed by: Georgeanne Nim, CRNA ?Pre-anesthesia Checklist: Patient identified, Emergency Drugs available, Suction available, Patient being monitored and Timeout performed ?Patient Re-evaluated:Patient Re-evaluated prior to induction ?Oxygen Delivery Method: Circle system utilized ?Preoxygenation: Pre-oxygenation with 100% oxygen ?Induction Type: IV induction ?Ventilation: Mask ventilation without difficulty ?Laryngoscope Size: Mac and 3 ?Grade View: Grade I ?Tube type: Oral ?Tube size: 7.0 mm ?Number of attempts: 1 ?Airway Equipment and Method: Stylet ?Placement Confirmation: ETT inserted through vocal cords under direct vision, positive ETCO2, CO2 detector and breath sounds checked- equal and bilateral ?Secured at: 21 cm ?Tube secured with: Tape ?Dental Injury: Teeth and Oropharynx as per pre-operative assessment  ? ? ? ? ?

## 2021-09-30 NOTE — Anesthesia Postprocedure Evaluation (Signed)
Anesthesia Post Note ? ?Patient: [name] ? ?Procedure(s) Performed: XI ROBOTIC ASSISTED LAPAROSCOPIC SACROCOLPOPEXY (Abdomen) ?CYSTOSCOPY (Bladder) ?EXAM UNDER ANESTHESIA (Vagina ) ? ?  ? ?Patient location during evaluation: PACU ?Anesthesia Type: General ?Level of consciousness: awake and alert ?Pain management: pain level controlled ?Vital Signs Assessment: post-procedure vital signs reviewed and stable ?Respiratory status: spontaneous breathing, nonlabored ventilation, respiratory function stable and patient connected to nasal cannula oxygen ?Cardiovascular status: blood pressure returned to baseline and stable ?Postop Assessment: no apparent nausea or vomiting ?Anesthetic complications: no ? ? ?No notable events documented. ? ?Last Vitals:  ?Vitals:  ? 09/30/21 1600 09/30/21 1615  ?BP: 133/75 129/79  ?Pulse: 81 80  ?Resp: 14 14  ?Temp:    ?SpO2: 97% 97%  ?  ?Last Pain:  ?Vitals:  ? 09/30/21 1630  ?TempSrc:   ?PainSc: 0-No pain  ? ? ?  ?  ?  ?  ?  ?  ? ?Trevor Iha ? ? ? ? ?

## 2021-10-01 ENCOUNTER — Telehealth: Payer: Self-pay | Admitting: Obstetrics and Gynecology

## 2021-10-01 ENCOUNTER — Encounter (HOSPITAL_BASED_OUTPATIENT_CLINIC_OR_DEPARTMENT_OTHER): Payer: Self-pay | Admitting: Obstetrics and Gynecology

## 2021-10-01 NOTE — Telephone Encounter (Signed)
Post- Op Call  Christina Russo underwent Robotic sacrocolpopexy, cystoscopy on 09/30/2021 with Dr Florian Buff. The patient reports that her pain is controlled. She is taking 1 oxycodone pt is alternating tylenol ans ibuprofen . She denies vaginal bleeding. She has not had a bowel movement and is taking Miralax for a bowel regimen. She was discharged without a catheter and will return on 11/10/2021 for a post op  [name], CMA

## 2021-10-01 NOTE — Telephone Encounter (Signed)
LMOVM for pt to call office 

## 2021-10-01 NOTE — Telephone Encounter (Signed)
Christina Russo underwent Robotic sacrocolpopexy, cystoscopy on 09/30/21.   Christina Russo passed her voiding trial.  was backfilled into the bladder Voided  PVR by bladder scan was 41ml.   Christina Russo was discharged without a catheter. Please call her for a routine post op check. Thanks!  [name], MD

## 2021-10-06 ENCOUNTER — Encounter: Payer: Self-pay | Admitting: *Deleted

## 2021-11-10 ENCOUNTER — Ambulatory Visit (INDEPENDENT_AMBULATORY_CARE_PROVIDER_SITE_OTHER): Payer: Medicare HMO | Admitting: Obstetrics and Gynecology

## 2021-11-10 ENCOUNTER — Encounter: Payer: Self-pay | Admitting: Obstetrics and Gynecology

## 2021-11-10 VITALS — BP 135/84 | HR 70

## 2021-11-10 DIAGNOSIS — Z9889 Other specified postprocedural states: Secondary | ICD-10-CM

## 2021-11-10 NOTE — Progress Notes (Signed)
[facility]  Date of Visit: 11/10/2021  History of Present Illness: [name] is a 66 y.o. female scheduled today for a post-operative visit.   Surgery: s/p Robotic assisted laparoscopic Sacrocolpopexy Lorna Few Lite Y), cystoscopy on 09/30/21  She passed her postoperative void trial.   Postoperative course has been uncomplicated.   Today she reports she has been feeling well overall.   UTI in the last 6 weeks? No  Pain? No  She has returned to her normal activity (except for postop restrictions), just feels tired easily. She has been walking twice a day. Has had some trouble sleeping.  Vaginal bulge? No  Stress incontinence: No  Urgency/frequency: No  Urge incontinence: No  Voiding dysfunction: No  Bowel issues: No   Subjective Success: Do you usually have a bulge or something falling out that you can see or feel in the vaginal area? Yes  Retreatment Success: Any retreatment with surgery or pessary for any compartment? No    Medications: She has a current medication list which includes the following prescription(s): calcium carbonate-vitamin d, vitamin d3, docusate calcium, estradiol, levothyroxine, melatonin, NONFORMULARY OR COMPOUNDED ITEM, and progesterone.   Allergies: Patient is allergic to codeine and penicillins.   Physical Exam: BP 135/84   Pulse 70   Abdomen: soft, non-tender, without masses or organomegaly Laparoscopic Incisions: healing well.  Pelvic Examination: Vagina: Incisions healing well. Sutures are not present at incision line and there is not granulation tissue. No tenderness along the anterior or posterior vagina. No apical tenderness. No pelvic masses. No visible or palpable mesh.  POP-Q: POP-Q  -2.5                                            Aa   -2.5                                           Ba  -8                                              C   3                                            Gh  4                                             Pb  8                                            tvl   -3                                            Ap  -  3                                            Bp                                                 D     ---------------------------------------------------------  Assessment and Plan:  1. Post-operative state     - Healing well.  - Can resume regular activity including exercise and intercourse,  if desired.  - Discussed avoidance of heavy lifting and straining long term to reduce the risk of recurrence.  - return to work letter provided.   Follow up as needed.   [name], MD

## 2021-12-09 ENCOUNTER — Ambulatory Visit: Payer: Medicare HMO | Admitting: Family

## 2021-12-10 ENCOUNTER — Ambulatory Visit (INDEPENDENT_AMBULATORY_CARE_PROVIDER_SITE_OTHER): Payer: Medicare HMO | Admitting: Family

## 2021-12-10 VITALS — BP 121/90 | HR 73 | Temp 98.3°F | Resp 16 | Wt 132.0 lb

## 2021-12-10 DIAGNOSIS — E039 Hypothyroidism, unspecified: Secondary | ICD-10-CM

## 2021-12-10 DIAGNOSIS — Z0000 Encounter for general adult medical examination without abnormal findings: Secondary | ICD-10-CM | POA: Diagnosis not present

## 2021-12-10 DIAGNOSIS — Z23 Encounter for immunization: Secondary | ICD-10-CM | POA: Diagnosis not present

## 2021-12-10 DIAGNOSIS — Z13820 Encounter for screening for osteoporosis: Secondary | ICD-10-CM

## 2021-12-10 LAB — COMPREHENSIVE METABOLIC PANEL
ALT: 14 U/L (ref 0–35)
AST: 17 U/L (ref 0–37)
Albumin: 4.3 g/dL (ref 3.5–5.2)
Alkaline Phosphatase: 63 U/L (ref 39–117)
BUN: 17 mg/dL (ref 6–23)
CO2: 29 mEq/L (ref 19–32)
Calcium: 9.3 mg/dL (ref 8.4–10.5)
Chloride: 103 mEq/L (ref 96–112)
Creatinine, Ser: 0.81 mg/dL (ref 0.40–1.20)
GFR: 74.65 mL/min (ref >60.00)
Glucose, Bld: 95 mg/dL (ref 70–99)
Potassium: 4.7 mEq/L (ref 3.5–5.1)
Sodium: 138 mEq/L (ref 135–145)
Total Bilirubin: 0.4 mg/dL (ref 0.2–1.2)
Total Protein: 6.9 g/dL (ref 6.0–8.3)

## 2021-12-10 LAB — TSH: TSH: 1.09 u[IU]/mL (ref 0.35–5.50)

## 2021-12-10 NOTE — Addendum Note (Signed)
Addended by: Wilford Corner on: 12/10/2021 09:49 AM   Modules accepted: Orders

## 2021-12-10 NOTE — Progress Notes (Signed)
Subjective:    Christina Russo is a 66 y.o. female who presents for a Welcome to Medicare exam.   Immunizations: prevnar 20 due today, she has not done any covid vaccines.  Diet: fair Wt Readings from Last 3 Encounters:  12/10/21 132 lb (59.9 kg)  09/30/21 133 lb 1.6 oz (60.4 kg)  06/25/21 130 lb (59 kg)  Exercise: fair Colonoscopy: 2018 Dexa: due Pap Smear: hysterectomy Mammogram: 07/10/21 Vision: up to date Dental: up to date  Denies fatigue/weight gain  Review of Systems  See HPI       Objective:    Today's Vitals   12/10/21 0732  BP: 121/90  Pulse: 73  Resp: 16  Temp: 98.3 F (36.8 C)  TempSrc: Oral  SpO2: 99%  Weight: 132 lb (59.9 kg)  Body mass index is 24.94 kg/m.  Medications Outpatient Encounter Medications as of 12/10/2021  Medication Sig   Calcium Carbonate-Vitamin D 600-400 MG-UNIT chew tablet Chew 1 tablet by mouth 2 (two) times daily.   Cholecalciferol (VITAMIN D3) 1000 units CAPS Take by mouth daily.   Docusate Calcium (STOOL SOFTENER PO) Take by mouth 2 (two) times daily as needed.   estradiol (ESTRACE) 0.5 MG tablet Take 1 tablet (0.5 mg total) by mouth daily. (Patient taking differently: Take 0.5 mg by mouth at bedtime.)   levothyroxine (SYNTHROID) 88 MCG tablet Take 1 tablet (88 mcg total) by mouth daily before breakfast. (Patient taking differently: Take 88 mcg by mouth daily before breakfast.)   Melatonin 3 MG TABS Take 2 tablets by mouth at bedtime.   NONFORMULARY OR COMPOUNDED ITEM CASCARA SAGRADA 1/2 TAB PO QD   progesterone (PROMETRIUM) 100 MG capsule TAKE ONE CAPSULE BY MOUTH DAILY AT BEDTIME (Patient taking differently: Take 100 mg by mouth at bedtime. TAKE ONE CAPSULE BY MOUTH DAILY AT BEDTIME)   No facility-administered encounter medications on file as of 12/10/2021.     History: Past Medical History:  Diagnosis Date   Heart murmur    "slight / intermittent"   Hypothyroidism    followed by pcp   Osteopenia    Prolapse of  vaginal walls    anterior ,  posterior, and vaginal vault   Wears contact lenses    Wears partial dentures    upper   Past Surgical History:  Procedure Laterality Date   COLONOSCOPY  2018   CYSTOSCOPY N/A 09/30/2021   Procedure: CYSTOSCOPY;  Surgeon: [name], MD;  Location: [facility];  Service: Gynecology;  Laterality: N/A;   ROBOTIC ASSISTED LAPAROSCOPIC SACROCOLPOPEXY N/A 09/30/2021   Procedure: XI ROBOTIC ASSISTED LAPAROSCOPIC SACROCOLPOPEXY;  Surgeon: [name], MD;  Location: [facility];  Service: Gynecology;  Laterality: N/A;  total time requested is 3 hours   TUBAL LIGATION Bilateral    yrs ago   VAGINAL HYSTERECTOMY  03/2001   @[facility]     Family History  Problem Relation Age of Onset   Diabetes Mother    Hypertension Mother    CVA Mother    Diabetes Father    Congestive Heart Failure Father    Hypertension Father    Heart attack Father    Glaucoma Sister    Blindness Sister    Heart attack Brother    Stroke Maternal Grandmother    Blindness Sister    Blindness Brother    Heart disease Brother    Drug abuse Son    Blindness Brother    Colon cancer Neg Hx  Esophageal cancer Neg Hx    Pancreatic cancer Neg Hx    Rectal cancer Neg Hx    Stomach cancer Neg Hx    Social History   Occupational History   Not on file  Tobacco Use   Smoking status: Former    Years: 5.00    Types: Cigarettes    Quit date: 1987    Years since quitting: 36.5   Smokeless tobacco: Never  Vaping Use   Vaping Use: Never used  Substance and Sexual Activity   Alcohol use: Yes    Alcohol/week: 7.0 standard drinks of alcohol    Types: 7 Glasses of wine per week    Comment: one daily wine   Drug use: Never   Sexual activity: Not on file    Comment: INTERCOURSE AGE 20, SEXUAL PARTNERS LESS THAN 5    Tobacco Counseling Counseling given: Not Answered   Immunizations and Health Maintenance Immunization History  Administered  Date(s) Administered   Influenza-Unspecified 03/04/2017, 02/13/2018   Pneumococcal Polysaccharide-23 01/10/2020   Tdap 06/08/2012   Zoster Recombinat (Shingrix) 03/31/2018, 06/15/2018   Health Maintenance Due  Topic Date Due   Pneumonia Vaccine 45+ Years old (2 - PCV) 01/09/2021   MAMMOGRAM  08/22/2021    Activities of Daily Living    09/30/2021   10:19 AM  In your present state of health, do you have any difficulty performing the following activities:  Hearing? 0  Vision? 0  Difficulty concentrating or making decisions? 0  Walking or climbing stairs? 0  Dressing or bathing? 0    Physical Exam  (optional), or other factors deemed appropriate based on the beneficiary's medical and social history and current clinical standards.  Physical Exam  Constitutional: She is oriented to person, place, and time. She appears well-developed and well-nourished. No distress.  HENT:  Head: Normocephalic and atraumatic.  Right Ear: Tympanic membrane and ear canal normal.  Left Ear: Tympanic membrane and ear canal normal.  Mouth/Throat: Oropharynx is clear and moist.  Eyes: Pupils are equal, round, and reactive to light. No scleral icterus.  Neck: Normal range of motion. No thyromegaly present.  Cardiovascular: Normal rate and regular rhythm.   No murmur heard. Pulmonary/Chest: Effort normal and breath sounds normal. No respiratory distress. He has no wheezes. She has no rales. She exhibits no tenderness.  Abdominal: Soft. Bowel sounds are normal. She exhibits no distension and no mass. There is no tenderness. There is no rebound and no guarding.  Musculoskeletal: She exhibits no edema.  Lymphadenopathy:    She has no cervical adenopathy.  Neurological: She is alert and oriented to person, place, and time. She has normal patellar reflexes. She exhibits normal muscle tone. Coordination normal.  Skin: Skin is warm and dry.  Psychiatric: She has a normal mood and affect. Her behavior is normal.  Judgment and thought content normal.  Breast/pelvic: deferred           Assessment & Plan:     Advanced Directives:   Not sure, she will check her paperwork. I gave her a packet with information on creating a healthcare advanced directive    Assessment:    This is a routine wellness examination for this patient . Declines covid vaccination.  Prevnar 20 today.  Refer for bone density.  Vision/Hearing screen (has a contact for near and far)  Vision Screening   Right eye Left eye Both eyes  Without correction     With correction 20/50 20/25 20/25  Dietary issues and exercise activities discussed:      Goals   None    Increase exercise   Depression Screen    12/10/2021    7:32 AM 10/15/2020    8:37 AM 03/31/2018   11:08 AM 03/09/2017    8:04 AM  PHQ 2/9 Scores  PHQ - 2 Score 0 0 0 0     Fall Risk    12/10/2021    7:32 AM  [place] in the past year? 0  Number falls in past yr: 0  Injury with Fall? 0    Cognitive Function:  Denies any concerns with memory      Patient Care Team: Debbrah Alar, NP as PCP - General (Internal Medicine) Marny Lowenstein NP, GYN    Plan:   Hypothyroid- continues synthroid 88 mcg once daily. Clinically stable. Obtain follow up TSH. Continue same.    I have personally reviewed and noted the following in the patient's chart:   Medical and social history Use of alcohol, tobacco or illicit drugs  Current medications and supplements Functional ability and status Nutritional status Physical activity Advanced directives List of other physicians Hospitalizations, surgeries, and ER visits in previous 12 months Vitals Screenings to include cognitive, depression, and falls Referrals and appointments  In addition, I have reviewed and discussed with patient certain preventive protocols, quality metrics, and best practice recommendations. A written personalized care plan for preventive services as well as  general preventive health recommendations were provided to patient.     Nance Pear, NP 12/10/2021

## 2021-12-24 ENCOUNTER — Encounter: Payer: Self-pay | Admitting: Obstetrics and Gynecology

## 2021-12-24 NOTE — Progress Notes (Signed)
Letter requested by patient's dentist for "clearance" for dental cleaning due to procedure performed in May.

## 2021-12-31 ENCOUNTER — Ambulatory Visit (HOSPITAL_BASED_OUTPATIENT_CLINIC_OR_DEPARTMENT_OTHER)
Admission: RE | Admit: 2021-12-31 | Discharge: 2021-12-31 | Disposition: A | Discharge status: Home / Self Care | Payer: Medicare HMO | Source: Ambulatory Visit | Attending: Family | Admitting: Family

## 2021-12-31 DIAGNOSIS — Z13820 Encounter for screening for osteoporosis: Secondary | ICD-10-CM | POA: Insufficient documentation

## 2021-12-31 DIAGNOSIS — M810 Age-related osteoporosis without current pathological fracture: Secondary | ICD-10-CM | POA: Diagnosis not present

## 2021-12-31 DIAGNOSIS — E039 Hypothyroidism, unspecified: Secondary | ICD-10-CM | POA: Diagnosis not present

## 2021-12-31 DIAGNOSIS — Z780 Asymptomatic menopausal state: Secondary | ICD-10-CM | POA: Insufficient documentation

## 2022-01-05 ENCOUNTER — Other Ambulatory Visit: Payer: Self-pay | Admitting: Family

## 2022-01-05 DIAGNOSIS — M810 Age-related osteoporosis without current pathological fracture: Secondary | ICD-10-CM | POA: Insufficient documentation

## 2022-01-05 NOTE — Telephone Encounter (Signed)
Bone density has worsened slightly since the last time it was checked.  I would recommend that she start fosamax once weekly to help with bone density. Sit upright for 90 minutes after taking fosamax.  Continue caltrate and regular weight bearing exercise.  Rx pended for fosamax.

## 2022-01-07 NOTE — Telephone Encounter (Signed)
Patient advised of results and provider's recommendations. She declines prescription for fosamax and reports she will continue the caltrate and exercise.

## 2022-01-11 IMAGING — MG DIGITAL SCREENING BILAT W/ TOMO W/ CAD
8 series · 9 of 24 positions shown · non-contrast
Comparison: Previous exam(s).

CLINICAL DATA: Screening.

EXAM:
DIGITAL SCREENING BILATERAL MAMMOGRAM WITH TOMO AND CAD

[L MLO synth-2D]
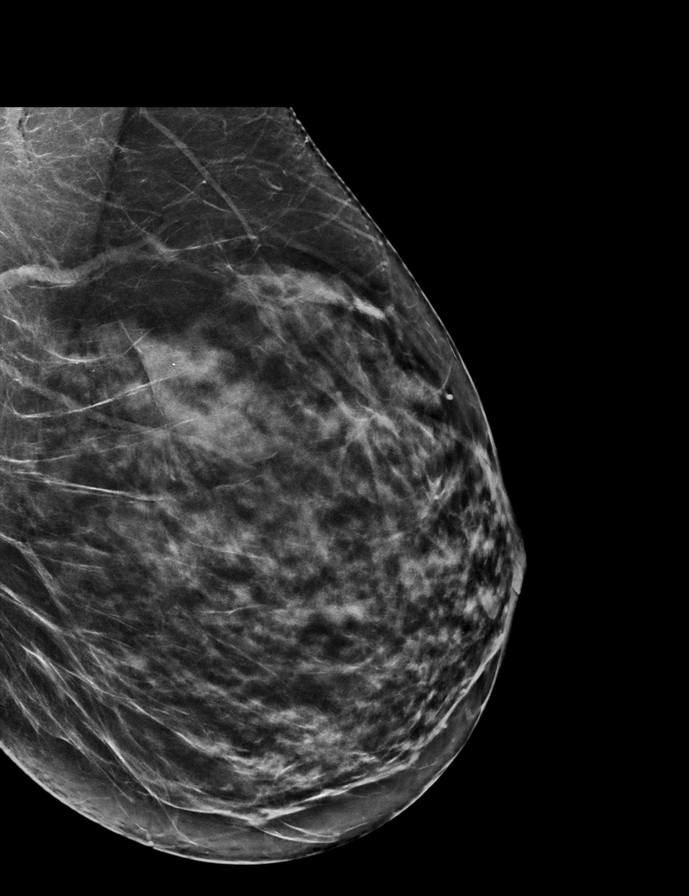

[R MLO synth-2D]
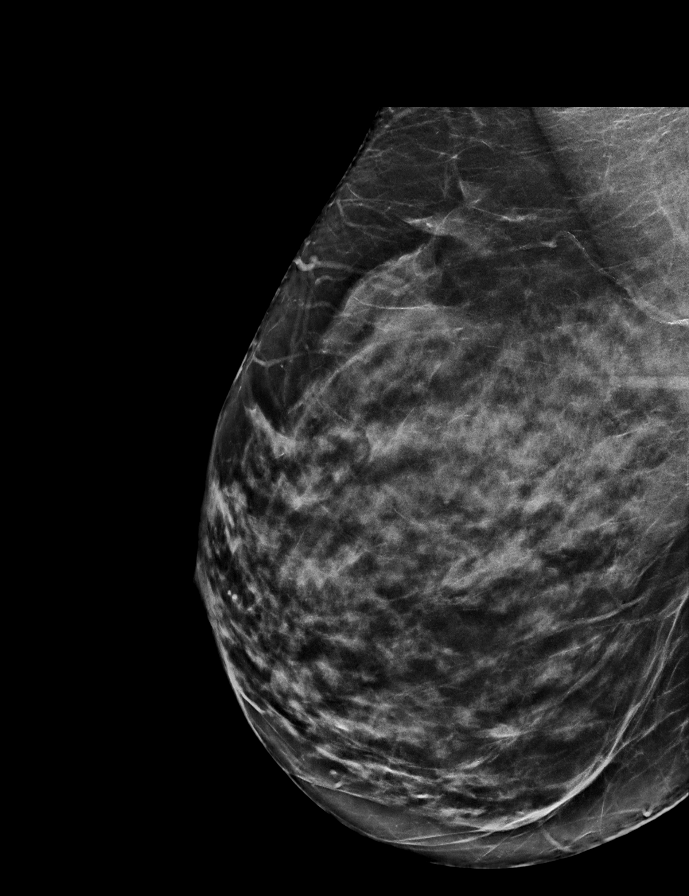

[R CC synth-2D]
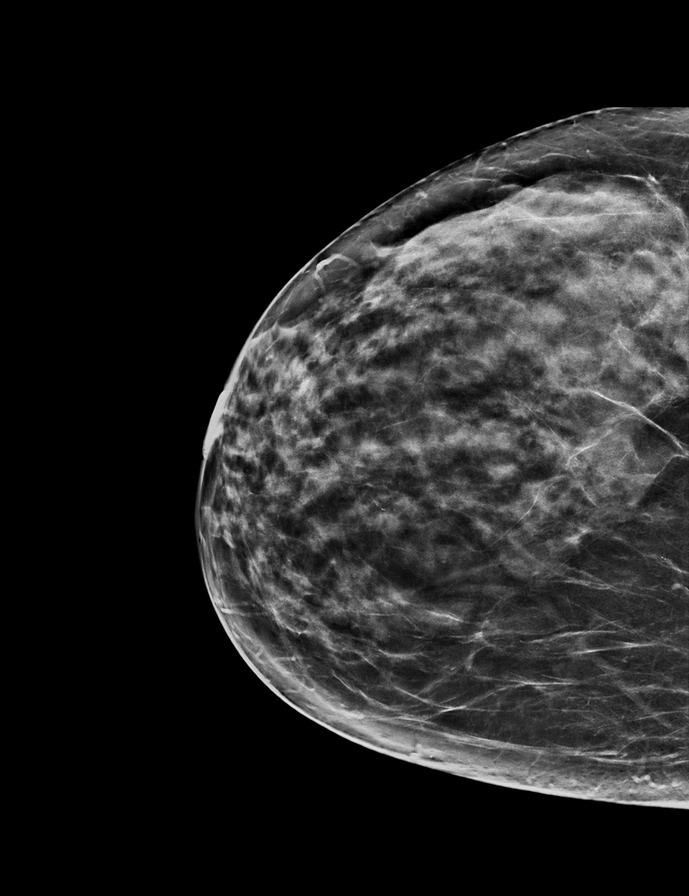

[L CC synth-2D]
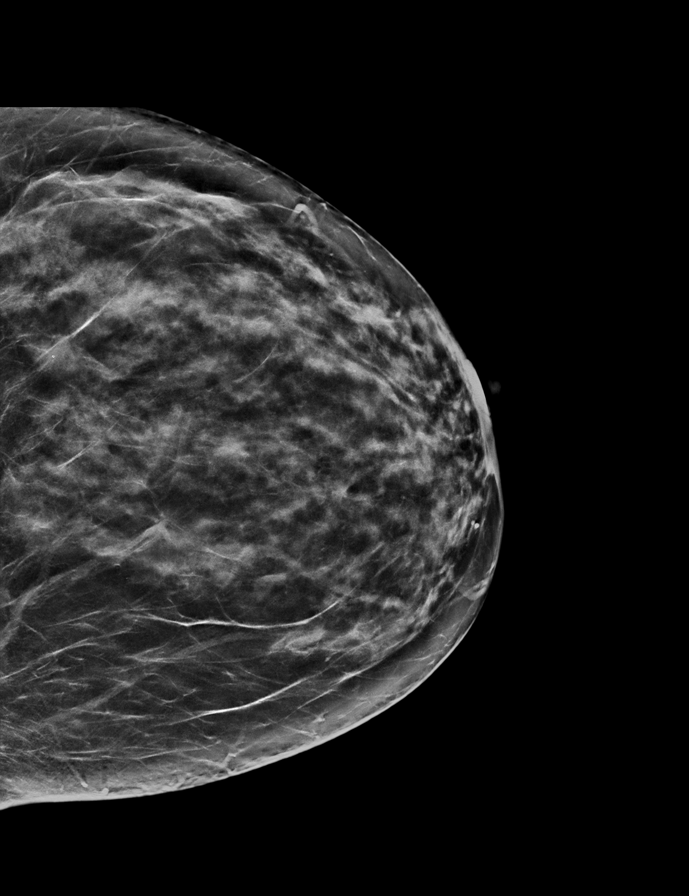

[R MLO tomo · 2 of 67 frames shown]
[frame 22/67]
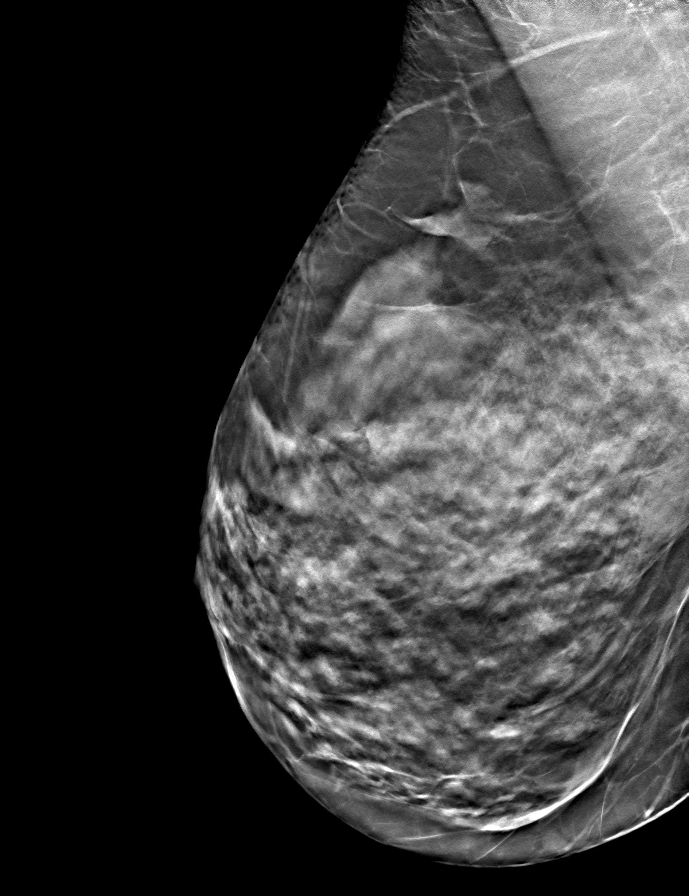
[frame 34/67]
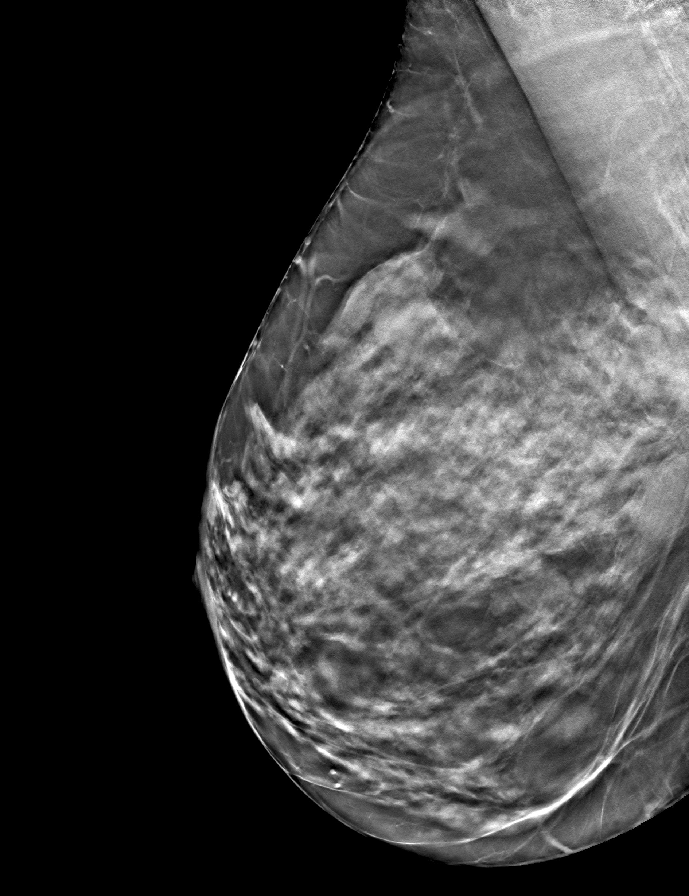

[L CC tomo · tomo slice 35/68.0]
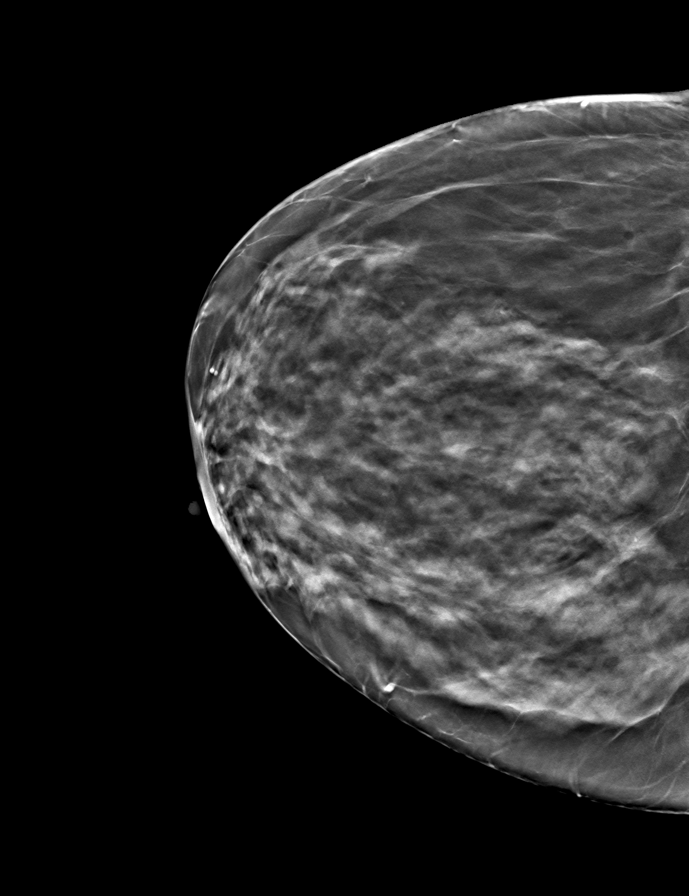

[R CC tomo · tomo slice 35/69.0]
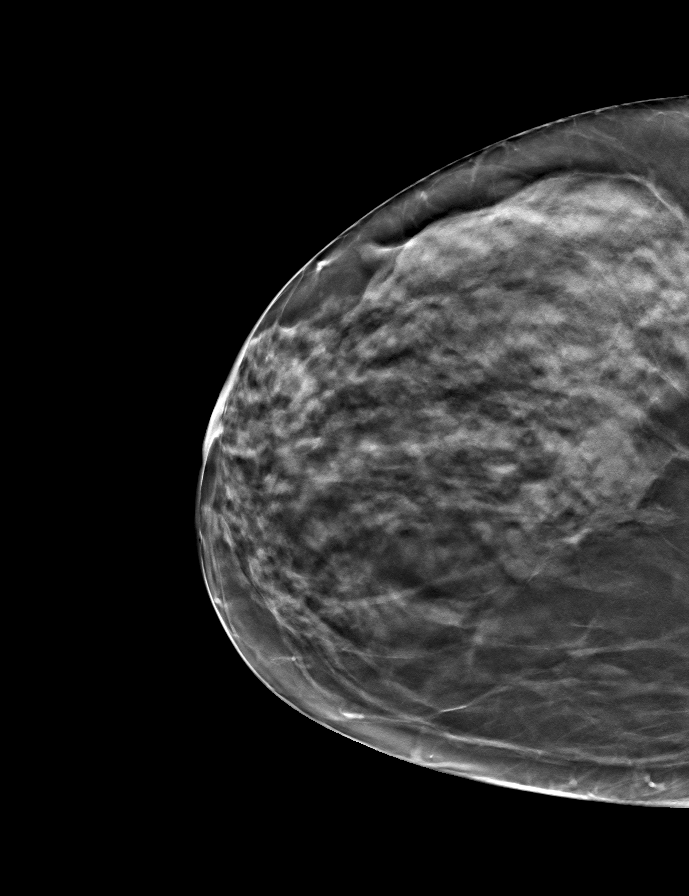

[L MLO tomo · tomo slice 35/69.0]
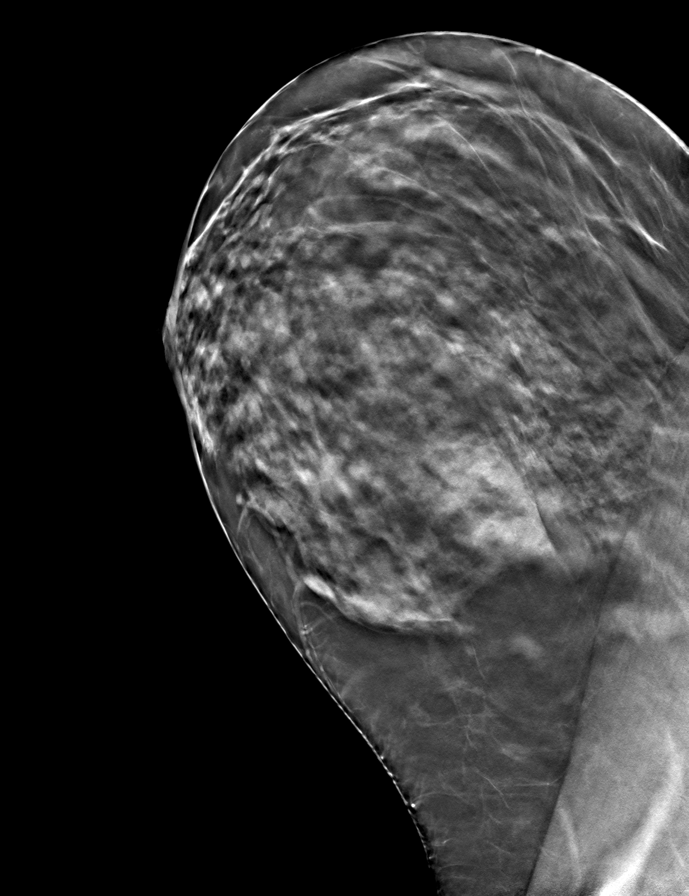

[9 of 24 positions shown; findings below may reference images not displayed]

ACR Breast Density Category c: The breast tissue is heterogeneously
dense, which may obscure small masses.
FINDINGS: In the left breast, possible distortion warrants further evaluation.
In the right breast, no findings suspicious for malignancy. Images
were processed with CAD.
IMPRESSION: Further evaluation is suggested for possible distortion in the left
breast.

RECOMMENDATION:
Diagnostic mammogram and possibly ultrasound of the left breast.
(Code:J9-G-HHO)

The patient will be contacted regarding the findings, and additional
imaging will be scheduled.

BI-RADS CATEGORY  0: Incomplete. Need additional imaging evaluation
and/or prior mammograms for comparison.

## 2022-01-21 ENCOUNTER — Other Ambulatory Visit: Payer: Self-pay | Admitting: Family

## 2022-01-21 DIAGNOSIS — E038 Other specified hypothyroidism: Secondary | ICD-10-CM

## 2022-03-04 DIAGNOSIS — K5900 Constipation, unspecified: Secondary | ICD-10-CM | POA: Diagnosis not present

## 2022-03-04 DIAGNOSIS — Z79890 Hormone replacement therapy (postmenopausal): Secondary | ICD-10-CM | POA: Diagnosis not present

## 2022-03-04 DIAGNOSIS — E039 Hypothyroidism, unspecified: Secondary | ICD-10-CM | POA: Diagnosis not present

## 2022-03-04 DIAGNOSIS — Z87891 Personal history of nicotine dependence: Secondary | ICD-10-CM | POA: Diagnosis not present

## 2022-03-04 DIAGNOSIS — Z880 Allergy status to penicillin: Secondary | ICD-10-CM | POA: Diagnosis not present

## 2022-03-04 DIAGNOSIS — I739 Peripheral vascular disease, unspecified: Secondary | ICD-10-CM | POA: Diagnosis not present

## 2022-03-04 DIAGNOSIS — R030 Elevated blood-pressure reading, without diagnosis of hypertension: Secondary | ICD-10-CM | POA: Diagnosis not present

## 2022-03-04 DIAGNOSIS — Z833 Family history of diabetes mellitus: Secondary | ICD-10-CM | POA: Diagnosis not present

## 2022-03-04 DIAGNOSIS — Z8249 Family history of ischemic heart disease and other diseases of the circulatory system: Secondary | ICD-10-CM | POA: Diagnosis not present

## 2022-03-04 DIAGNOSIS — Z823 Family history of stroke: Secondary | ICD-10-CM | POA: Diagnosis not present

## 2022-03-04 DIAGNOSIS — Z008 Encounter for other general examination: Secondary | ICD-10-CM | POA: Diagnosis not present

## 2022-05-13 ENCOUNTER — Encounter: Payer: Self-pay | Admitting: Nurse Practitioner

## 2022-05-24 NOTE — Progress Notes (Unsigned)
   Christina Russo 1955-10-11 [id number]   History:  67 y.o. G3P0003 presents for medication management. Postmenopausal - on HRT. S/P 2002 TVH. Wants to switch from patch to pill due to costs. She requests Prometrium as she feels better while taking. She has tried to wean off HRT but does not tolerate. History of hypothyroidism and osteoporosis - declines treatment, most recent showed osteopenia.   Gynecologic History No LMP recorded. Patient has had a hysterectomy.   Contraception: status post hysterectomy  Health maintenance Last Pap: No longer screening per guidelines Last mammogram: 08/23/2019. Results were: Left breast distortion (no follow up done by patient) Last colonoscopy: 04/15/2017. Results were: Normal, 10-year recall Last Dexa: 12/31/2021. Results were: T-score -2.6  Past medical history, past surgical history, family history and social history were all reviewed and documented in the EPIC chart. Works for medication management doing research. Watches her grandkids ages 66 and 8 on the weekends.   ROS:  A ROS was performed and pertinent positives and negatives are included.  Exam:  There were no vitals filed for this visit.   There is no height or weight on file to calculate BMI.  General appearance:  Normal Thyroid:  Symmetrical, normal in size, without palpable masses or nodularity. Respiratory  Auscultation:  Clear without wheezing or rhonchi Cardiovascular  Auscultation:  Regular rate, without rubs, murmurs or gallops  Edema/varicosities:  Not grossly evident Abdominal  Soft,nontender, without masses, guarding or rebound.  Liver/spleen:  No organomegaly noted  Hernia:  None appreciated  Skin  Inspection:  Grossly normal   Breasts: Examined lying and sitting.   Right: Without masses, retractions, discharge or axillary adenopathy.   Left: Without masses, retractions, discharge or axillary adenopathy. Genitourinary   Inguinal/mons:  Normal without inguinal  adenopathy  External genitalia:  Normal appearing vulva with no masses, tenderness, or lesions  BUS/Urethra/Skene's glands:  Normal  Vagina:  Normal appearing with normal color and discharge, no lesions  Cervix:  Absent  Uterus:  Absent  Adnexa/parametria:     Rt: Normal in size, without masses or tenderness.   Lt: Normal in size, without masses or tenderness.  Anus and perineum: Normal  Digital rectal exam: Normal sphincter tone without palpated masses or tenderness  Patient informed chaperone available to be present for breast and pelvic exam. Patient has requested no chaperone to be present. Patient has been advised what will be completed during breast and pelvic exam.   Assessment/Plan:  67 y.o. G3P0003 for breast and pelvic exam.   Hormone replacement therapy (HRT) - Plan: estradiol (ESTRACE) 0.5 MG tablet daily, progesterone (PROMETRIUM) 100 MG capsule nightly. Has been doing wel on patches but due to costs wants to switch to pill form. She has tried to wean and did not tolerate. She is aware of risks of blood clots, heart attack, stroke, and breast cancer with continued use. She wants to continue. Refill x 1 year provided.   Return in 1 year for breast and pelvic exam.      Tamela Gammon [facility], 1:31 PM 05/24/2022

## 2022-05-25 ENCOUNTER — Ambulatory Visit (INDEPENDENT_AMBULATORY_CARE_PROVIDER_SITE_OTHER): Payer: Medicare HMO | Admitting: Nurse Practitioner

## 2022-05-25 ENCOUNTER — Encounter: Payer: Self-pay | Admitting: Nurse Practitioner

## 2022-05-25 VITALS — BP 92/62 | HR 67 | Ht 61.0 in | Wt 134.0 lb

## 2022-05-25 DIAGNOSIS — M810 Age-related osteoporosis without current pathological fracture: Secondary | ICD-10-CM | POA: Diagnosis not present

## 2022-05-25 DIAGNOSIS — Z79890 Hormone replacement therapy (postmenopausal): Secondary | ICD-10-CM | POA: Diagnosis not present

## 2022-05-25 MED ORDER — ESTRADIOL 0.5 MG PO TABS: 0.5000 mg | ORAL_TABLET | Freq: Every day | ORAL | 3 refills | Status: DC

## 2022-05-25 MED ORDER — PROGESTERONE MICRONIZED 100 MG PO CAPS: ORAL_CAPSULE | ORAL | 3 refills | Status: DC

## 2022-06-19 ENCOUNTER — Other Ambulatory Visit: Payer: Self-pay | Admitting: Family

## 2022-06-19 DIAGNOSIS — E038 Other specified hypothyroidism: Secondary | ICD-10-CM

## 2022-07-13 DIAGNOSIS — Z1231 Encounter for screening mammogram for malignant neoplasm of breast: Secondary | ICD-10-CM | POA: Diagnosis not present

## 2022-11-26 ENCOUNTER — Other Ambulatory Visit: Payer: Self-pay

## 2022-11-26 DIAGNOSIS — E038 Other specified hypothyroidism: Secondary | ICD-10-CM

## 2022-11-26 MED ORDER — LEVOTHYROXINE SODIUM 88 MCG PO TABS: 88.0000 ug | ORAL_TABLET | Freq: Every day | ORAL | 1 refills | Status: DC

## 2022-11-30 ENCOUNTER — Encounter: Payer: Medicare HMO | Admitting: Family

## 2022-11-30 DIAGNOSIS — R69 Illness, unspecified: Secondary | ICD-10-CM | POA: Diagnosis not present

## 2022-12-14 ENCOUNTER — Other Ambulatory Visit (INDEPENDENT_AMBULATORY_CARE_PROVIDER_SITE_OTHER): Payer: Medicare HMO

## 2022-12-14 ENCOUNTER — Ambulatory Visit (INDEPENDENT_AMBULATORY_CARE_PROVIDER_SITE_OTHER): Payer: Medicare HMO | Admitting: *Deleted

## 2022-12-14 VITALS — BP 126/78 | HR 69 | Ht 61.0 in | Wt 136.2 lb

## 2022-12-14 DIAGNOSIS — E039 Hypothyroidism, unspecified: Secondary | ICD-10-CM | POA: Diagnosis not present

## 2022-12-14 DIAGNOSIS — Z0000 Encounter for general adult medical examination without abnormal findings: Secondary | ICD-10-CM

## 2022-12-14 LAB — TSH: TSH: 1.17 u[IU]/mL (ref 0.35–5.50)

## 2022-12-14 NOTE — Progress Notes (Signed)
Subjective:   Christina Russo is a 67 y.o. female who presents for Medicare Annual (Subsequent) preventive examination.  Visit Complete: In person   Review of Systems     Cardiac Risk Factors include: advanced age (>27men, >36 women)     Objective:    Today's Vitals   12/14/22 0817  BP: 126/78  Pulse: 69  Weight: 136 lb 3.2 oz (61.8 kg)  Height: 5\' 1"  (1.549 m)   Body mass index is 25.73 kg/m.     12/14/2022    8:26 AM 09/30/2021   10:11 AM  Advanced Directives  Does Patient Have a Medical Advance Directive? No Yes  Type of Advance Directive  Healthcare Power of Attorney  Would patient like information on creating a medical advance directive? No - Patient declined     Current Medications (verified) Outpatient Encounter Medications as of 12/14/2022  Medication Sig   Calcium Carbonate-Vitamin D 600-400 MG-UNIT chew tablet Chew 1 tablet by mouth 2 (two) times daily.   Cholecalciferol (VITAMIN D3) 1000 units CAPS Take by mouth daily.   Docusate Calcium (STOOL SOFTENER PO) Take by mouth 2 (two) times daily as needed.   estradiol (ESTRACE) 0.5 MG tablet Take 1 tablet (0.5 mg total) by mouth daily.   levothyroxine (SYNTHROID) 88 MCG tablet Take 1 tablet (88 mcg total) by mouth daily before breakfast.   Melatonin 3 MG TABS Take 2 tablets by mouth at bedtime.   NONFORMULARY OR COMPOUNDED ITEM CASCARA SAGRADA 1/2 TAB PO QD   progesterone (PROMETRIUM) 100 MG capsule TAKE ONE CAPSULE BY MOUTH DAILY AT BEDTIME   No facility-administered encounter medications on file as of 12/14/2022.    Allergies (verified) Codeine and Penicillins   History: Past Medical History:  Diagnosis Date   Allergy    Heart murmur    "slight / intermittent"   Hypothyroidism    followed by pcp   Osteopenia    Prolapse of vaginal walls    anterior ,  posterior, and vaginal vault   Wears contact lenses    Wears partial dentures    upper   Past Surgical History:  Procedure Laterality Date    COLONOSCOPY  2018   CYSTOSCOPY N/A 09/30/2021   Procedure: CYSTOSCOPY;  Surgeon: [name], MD;  Location: [facility];  Service: Gynecology;  Laterality: N/A;   ROBOTIC ASSISTED LAPAROSCOPIC SACROCOLPOPEXY N/A 09/30/2021   Procedure: XI ROBOTIC ASSISTED LAPAROSCOPIC SACROCOLPOPEXY;  Surgeon: [name], MD;  Location: [facility];  Service: Gynecology;  Laterality: N/A;  total time requested is 3 hours   TUBAL LIGATION Bilateral    yrs ago   VAGINAL HYSTERECTOMY  03/2001   @[facility]    Family History  Problem Relation Age of Onset   Diabetes Mother    Hypertension Mother    CVA Mother    Stroke Mother    Diabetes Father    Congestive Heart Failure Father    Hypertension Father    Heart attack Father    Heart disease Father    Glaucoma Sister    Blindness Sister    Heart attack Brother    Stroke Maternal Grandmother    Blindness Sister    Blindness Brother    Heart disease Brother    Diabetes Brother    Drug abuse Son    Blindness Brother    Drug abuse Brother    Colon cancer Neg Hx    Esophageal cancer Neg Hx    Pancreatic cancer Neg  Hx    Rectal cancer Neg Hx    Stomach cancer Neg Hx    Social History   Socioeconomic History   Marital status: Divorced    Spouse name: Not on file   Number of children: Not on file   Years of education: Not on file   Highest education level: Not on file  Occupational History   Not on file  Tobacco Use   Smoking status: Former    Current packs/day: 0.00    Types: Cigarettes    Start date: 42    Quit date: 35    Years since quitting: 37.6   Smokeless tobacco: Never  Vaping Use   Vaping status: Never Used  Substance and Sexual Activity   Alcohol use: Yes    Alcohol/week: 7.0 standard drinks of alcohol    Types: 7 Glasses of wine per week    Comment: one daily wine   Drug use: Never   Sexual activity: Not Currently    Birth control/protection: Surgical    Comment: Hyst,  First IC >16 y/o, <5 Partners, DES-neg, No Hx of STIs  Other Topics Concern   Not on file  Social History Narrative   Works in Producer, television/film/video at [employer]   Divorced   3 children and 9 grandchildren and 1 great grandchild   Her children live locally   Enjoys reading, cooking, gardening, dance, music   Social Determinants of Health   Financial Resource Strain: Low Risk  (12/14/2022)   Overall Financial Resource Strain (CARDIA)    Difficulty of Paying Living Expenses: Not hard at all  Food Insecurity: No Food Insecurity (12/14/2022)   Hunger Vital Sign    Worried About Running Out of Food in the Last Year: Never true    Ran Out of Food in the Last Year: Never true  Transportation Needs: No Transportation Needs (12/14/2022)   PRAPARE - Administrator, Civil Service (Medical): No    Lack of Transportation (Non-Medical): No  Physical Activity: Insufficiently Active (12/14/2022)   Exercise Vital Sign    Days of Exercise per Week: 3 days    Minutes of Exercise per Session: 20 min  Stress: No Stress Concern Present (12/14/2022)   [employer] of Occupational Health - Occupational Stress Questionnaire    Feeling of Stress : Not at all  Social Connections: Socially Isolated (12/14/2022)   Social Connection and Isolation Panel [NHANES]    Frequency of Communication with Friends and Family: Three times a week    Frequency of Social Gatherings with Friends and Family: Three times a week    Attends Religious Services: Never    Active Member of Clubs or Organizations: No    Attends Banker Meetings: Never    Marital Status: Divorced    Tobacco Counseling Counseling given: Not Answered   Clinical Intake:  Pre-visit preparation completed: Yes  Pain : No/denies pain  BMI - recorded: 25.73 Nutritional Status: BMI 25 -29 Overweight Nutritional Risks: None Diabetes: No  How often do you need to have someone help you when you read instructions, pamphlets, or  other written materials from your doctor or pharmacy?: 1 - Never  Interpreter Needed?: No  Information entered by :: Donne Anon, CMA   Activities of Daily Living    12/14/2022    8:18 AM  In your present state of health, do you have any difficulty performing the following activities:  Hearing? 0  Vision? 0  Difficulty concentrating or making decisions? 0  Walking or climbing stairs? 0  Dressing or bathing? 0  Doing errands, shopping? 0  Preparing Food and eating ? N  Using the Toilet? N  In the past six months, have you accidently leaked urine? N  Do you have problems with loss of bowel control? N  Managing your Medications? N  Managing your Finances? N  Housekeeping or managing your Housekeeping? N    Patient Care Team: Sandford Craze, NP as PCP - General (Internal Medicine) Olivia Mackie, NP as Nurse Practitioner (Gynecology)  Indicate any recent Medical Services you may have received from other than [facility] providers in the past year (date may be approximate).     Assessment:   This is a routine wellness examination for Marixa.  Hearing/Vision screen No results found.  Dietary issues and exercise activities discussed:     Goals Addressed   None    Depression Screen    12/14/2022    8:25 AM 12/10/2021    7:32 AM 10/15/2020    8:37 AM 03/31/2018   11:08 AM 03/09/2017    8:04 AM  PHQ 2/9 Scores  PHQ - 2 Score 0 0 0 0 0    Fall Risk    12/14/2022    8:21 AM 12/10/2021    7:32 AM 10/15/2020    8:36 AM  Fall Risk   Falls in the past year? 0 0 0  Number falls in past yr: 0 0 0  Injury with Fall? 0 0 0  Risk for fall due to : No Fall Risks    Follow up Falls evaluation completed      MEDICARE RISK AT HOME:  Medicare Risk at Home - 12/14/22 0820     Any stairs in or around the home? No    If so, are there any without handrails? No    Home free of loose throw rugs in walkways, pet beds, electrical cords, etc? Yes    Adequate lighting in your home  to reduce risk of falls? Yes    Life alert? No    Use of a cane, walker or w/c? No    Grab bars in the bathroom? No    Shower chair or bench in shower? Yes   built in seat   Elevated toilet seat or a handicapped toilet? No             TIMED UP AND GO:  Was the test performed?  Yes  Length of time to ambulate 10 feet: 5 sec Gait steady and fast without use of assistive device    Cognitive Function:        12/14/2022    8:27 AM  6CIT Screen  What Year? 0 points  What month? 0 points  What time? 0 points  Count back from 20 0 points  Months in reverse 0 points  Repeat phrase 0 points  Total Score 0 points    Immunizations Immunization History  Administered Date(s) Administered   Influenza-Unspecified 03/04/2017, 02/13/2018   PNEUMOCOCCAL CONJUGATE-20 12/10/2021   Pneumococcal Polysaccharide-23 01/10/2020   Tdap 06/08/2012   Zoster Recombinant(Shingrix) 03/31/2018, 06/15/2018    TDAP status: Due, Education has been provided regarding the importance of this vaccine. Advised may receive this vaccine at local pharmacy or Health Dept. Aware to provide a copy of the vaccination record if obtained from local pharmacy or Health Dept. Verbalized acceptance and understanding.  Flu Vaccine status: Up to date  Pneumococcal vaccine status: Up to date  Covid-19 vaccine status: Information  provided on how to obtain vaccines.   Qualifies for Shingles Vaccine? Yes   Zostavax completed No   Shingrix Completed?: Yes  Screening Tests Health Maintenance  Topic Date Due   COVID-19 Vaccine (1 - 2023-24 season) Never done   DTaP/Tdap/Td (2 - Td or Tdap) 06/08/2022   Medicare Annual Wellness (AWV)  12/11/2022   INFLUENZA VACCINE  12/16/2022   MAMMOGRAM  07/14/2023   Colonoscopy  04/16/2027   Pneumonia Vaccine 59+ Years old  Completed   DEXA SCAN  Completed   Hepatitis C Screening  Completed   Zoster Vaccines- Shingrix  Completed   HPV VACCINES  Aged Out    Health  Maintenance  Health Maintenance Due  Topic Date Due   COVID-19 Vaccine (1 - 2023-24 season) Never done   DTaP/Tdap/Td (2 - Td or Tdap) 06/08/2022   Medicare Annual Wellness (AWV)  12/11/2022    Colorectal cancer screening: Type of screening: Colonoscopy. Completed 04/15/17. Repeat every 10 years  Mammogram status: Completed 07/13/21. Repeat every year  Bone Density status: Completed 12/31/21. Results reflect: Bone density results: OSTEOPOROSIS. Repeat every 2 years.  Lung Cancer Screening: (Low Dose CT Chest recommended if Age 75-80 years, 20 pack-year currently smoking OR have quit w/in 15years.) does not qualify.   Additional Screening:  Hepatitis C Screening: does qualify; Completed 05/17/12  Vision Screening: Recommended annual ophthalmology exams for early detection of glaucoma and other disorders of the eye. Is the patient up to date with their annual eye exam?  Yes  Who is the provider or what is the name of the office in which the patient attends annual eye exams? [facility] [place] If pt is not established with a provider, would they like to be referred to a provider to establish care? No .   Dental Screening: Recommended annual dental exams for proper oral hygiene  Diabetic Foot Exam: N/a  Community Resource Referral / Chronic Care Management: CRR required this visit?  No   CCM required this visit?  No     Plan:     I have personally reviewed and noted the following in the patient's chart:   Medical and social history Use of alcohol, tobacco or illicit drugs  Current medications and supplements including opioid prescriptions. Patient is not currently taking opioid prescriptions. Functional ability and status Nutritional status Physical activity Advanced directives List of other physicians Hospitalizations, surgeries, and ER visits in previous 12 months Vitals Screenings to include cognitive, depression, and falls Referrals and  appointments  In addition, I have reviewed and discussed with patient certain preventive protocols, quality metrics, and best practice recommendations. A written personalized care plan for preventive services as well as general preventive health recommendations were provided to patient.     Donne Anon, CMA   12/14/2022   After Visit Summary: Sent to mychart.  Nurse Notes: None

## 2022-12-14 NOTE — Patient Instructions (Signed)
[name] , Thank you for taking time to come for your Medicare Wellness Visit. I appreciate your ongoing commitment to your health goals. Please review the following plan we discussed and let me know if I can assist you in the future.   These are the goals we discussed:  Goals   None     This is a list of the screening recommended for you and due dates:  Health Maintenance  Topic Date Due   COVID-19 Vaccine (1 - 2023-24 season) Never done   DTaP/Tdap/Td vaccine (2 - Td or Tdap) 06/08/2022   Flu Shot  12/16/2022   Mammogram  07/14/2023   Medicare Annual Wellness Visit  12/14/2023   Colon Cancer Screening  04/16/2027   Pneumonia Vaccine  Completed   DEXA scan (bone density measurement)  Completed   Hepatitis C Screening  Completed   Zoster (Shingles) Vaccine  Completed   HPV Vaccine  Aged Out     Next appointment: Follow up in one year for your annual wellness visit.   Preventive Care 40 Years and Older, Female Preventive care refers to lifestyle choices and visits with your health care provider that can promote health and wellness. What does preventive care include? A yearly physical exam. This is also called an annual well check. Dental exams once or twice a year. Routine eye exams. Ask your health care provider how often you should have your eyes checked. Personal lifestyle choices, including: Daily care of your teeth and gums. Regular physical activity. Eating a healthy diet. Avoiding tobacco and drug use. Limiting alcohol use. Practicing safe sex. Taking low-dose aspirin every day. Taking vitamin and mineral supplements as recommended by your health care provider. What happens during an annual well check? The services and screenings done by your health care provider during your annual well check will depend on your age, overall health, lifestyle risk factors, and family history of disease. Counseling  Your health care provider may ask you questions about  your: Alcohol use. Tobacco use. Drug use. Emotional well-being. Home and relationship well-being. Sexual activity. Eating habits. History of falls. Memory and ability to understand (cognition). Work and work Astronomer. Reproductive health. Screening  You may have the following tests or measurements: Height, weight, and BMI. Blood pressure. Lipid and cholesterol levels. These may be checked every 5 years, or more frequently if you are over 60 years old. Skin check. Lung cancer screening. You may have this screening every year starting at age 65 if you have a 30-pack-year history of smoking and currently smoke or have quit within the past 15 years. Fecal occult blood test (FOBT) of the stool. You may have this test every year starting at age 59. Flexible sigmoidoscopy or colonoscopy. You may have a sigmoidoscopy every 5 years or a colonoscopy every 10 years starting at age 69. Hepatitis C blood test. Hepatitis B blood test. Sexually transmitted disease (STD) testing. Diabetes screening. This is done by checking your blood sugar (glucose) after you have not eaten for a while (fasting). You may have this done every 1-3 years. Bone density scan. This is done to screen for osteoporosis. You may have this done starting at age 24. Mammogram. This may be done every 1-2 years. Talk to your health care provider about how often you should have regular mammograms. Talk with your health care provider about your test results, treatment options, and if necessary, the need for more tests. Vaccines  Your health care provider may recommend certain vaccines, such as:  Influenza vaccine. This is recommended every year. Tetanus, diphtheria, and acellular pertussis (Tdap, Td) vaccine. You may need a Td booster every 10 years. Zoster vaccine. You may need this after age 74. Pneumococcal 13-valent conjugate (PCV13) vaccine. One dose is recommended after age 60. Pneumococcal polysaccharide (PPSV23) vaccine.  One dose is recommended after age 82. Talk to your health care provider about which screenings and vaccines you need and how often you need them. This information is not intended to replace advice given to you by your health care provider. Make sure you discuss any questions you have with your health care provider. Document Released: 05/30/2015 Document Revised: 01/21/2016 Document Reviewed: 03/04/2015 Elsevier Interactive Patient Education  2017 [employer].  Fall Prevention in the Home Falls can cause injuries. They can happen to people of all ages. There are many things you can do to make your home safe and to help prevent falls. What can I do on the outside of my home? Regularly fix the edges of walkways and driveways and fix any cracks. Remove anything that might make you trip as you walk through a door, such as a raised step or threshold. Trim any bushes or trees on the path to your home. Use bright outdoor lighting. Clear any walking paths of anything that might make someone trip, such as rocks or tools. Regularly check to see if handrails are loose or broken. Make sure that both sides of any steps have handrails. Any raised decks and porches should have guardrails on the edges. Have any leaves, snow, or ice cleared regularly. Use sand or salt on walking paths during winter. Clean up any spills in your garage right away. This includes oil or grease spills. What can I do in the bathroom? Use night lights. Install grab bars by the toilet and in the tub and shower. Do not use towel bars as grab bars. Use non-skid mats or decals in the tub or shower. If you need to sit down in the shower, use a plastic, non-slip stool. Keep the floor dry. Clean up any water that spills on the floor as soon as it happens. Remove soap buildup in the tub or shower regularly. Attach bath mats securely with double-sided non-slip rug tape. Do not have throw rugs and other things on the floor that can make  you trip. What can I do in the bedroom? Use night lights. Make sure that you have a light by your bed that is easy to reach. Do not use any sheets or blankets that are too big for your bed. They should not hang down onto the floor. Have a firm chair that has side arms. You can use this for support while you get dressed. Do not have throw rugs and other things on the floor that can make you trip. What can I do in the kitchen? Clean up any spills right away. Avoid walking on wet floors. Keep items that you use a lot in easy-to-reach places. If you need to reach something above you, use a strong step stool that has a grab bar. Keep electrical cords out of the way. Do not use floor polish or wax that makes floors slippery. If you must use wax, use non-skid floor wax. Do not have throw rugs and other things on the floor that can make you trip. What can I do with my stairs? Do not leave any items on the stairs. Make sure that there are handrails on both sides of the stairs and use them.  Fix handrails that are broken or loose. Make sure that handrails are as long as the stairways. Check any carpeting to make sure that it is firmly attached to the stairs. Fix any carpet that is loose or worn. Avoid having throw rugs at the top or bottom of the stairs. If you do have throw rugs, attach them to the floor with carpet tape. Make sure that you have a light switch at the top of the stairs and the bottom of the stairs. If you do not have them, ask someone to add them for you. What else can I do to help prevent falls? Wear shoes that: Do not have high heels. Have rubber bottoms. Are comfortable and fit you well. Are closed at the toe. Do not wear sandals. If you use a stepladder: Make sure that it is fully opened. Do not climb a closed stepladder. Make sure that both sides of the stepladder are locked into place. Ask someone to hold it for you, if possible. Clearly mark and make sure that you can  see: Any grab bars or handrails. First and last steps. Where the edge of each step is. Use tools that help you move around (mobility aids) if they are needed. These include: Canes. Walkers. Scooters. Crutches. Turn on the lights when you go into a dark area. Replace any light bulbs as soon as they burn out. Set up your furniture so you have a clear path. Avoid moving your furniture around. If any of your floors are uneven, fix them. If there are any pets around you, be aware of where they are. Review your medicines with your doctor. Some medicines can make you feel dizzy. This can increase your chance of falling. Ask your doctor what other things that you can do to help prevent falls. This information is not intended to replace advice given to you by your health care provider. Make sure you discuss any questions you have with your health care provider. Document Released: 02/27/2009 Document Revised: 10/09/2015 Document Reviewed: 06/07/2014 Elsevier Interactive Patient Education  2017 [employer].

## 2022-12-15 ENCOUNTER — Encounter (INDEPENDENT_AMBULATORY_CARE_PROVIDER_SITE_OTHER): Payer: Self-pay

## 2023-01-10 ENCOUNTER — Encounter: Payer: Medicare HMO | Admitting: Family

## 2023-01-10 ENCOUNTER — Encounter: Payer: Self-pay | Admitting: Family

## 2023-01-10 ENCOUNTER — Ambulatory Visit (INDEPENDENT_AMBULATORY_CARE_PROVIDER_SITE_OTHER): Payer: Medicare HMO | Admitting: Family

## 2023-01-10 VITALS — BP 136/82 | HR 69 | Temp 98.0°F | Resp 16 | Ht 61.0 in | Wt 138.0 lb

## 2023-01-10 DIAGNOSIS — M810 Age-related osteoporosis without current pathological fracture: Secondary | ICD-10-CM

## 2023-01-10 DIAGNOSIS — E039 Hypothyroidism, unspecified: Secondary | ICD-10-CM

## 2023-01-10 DIAGNOSIS — Z0000 Encounter for general adult medical examination without abnormal findings: Secondary | ICD-10-CM | POA: Diagnosis not present

## 2023-01-10 DIAGNOSIS — Z79890 Hormone replacement therapy (postmenopausal): Secondary | ICD-10-CM | POA: Diagnosis not present

## 2023-01-10 DIAGNOSIS — R739 Hyperglycemia, unspecified: Secondary | ICD-10-CM | POA: Diagnosis not present

## 2023-01-10 DIAGNOSIS — Z1231 Encounter for screening mammogram for malignant neoplasm of breast: Secondary | ICD-10-CM

## 2023-01-10 LAB — COMPREHENSIVE METABOLIC PANEL
ALT: 15 U/L (ref 0–35)
AST: 17 U/L (ref 0–37)
Albumin: 4.3 g/dL (ref 3.5–5.2)
Alkaline Phosphatase: 61 U/L (ref 39–117)
BUN: 23 mg/dL (ref 6–23)
CO2: 27 mEq/L (ref 19–32)
Calcium: 9.4 mg/dL (ref 8.4–10.5)
Chloride: 100 mEq/L (ref 96–112)
Creatinine, Ser: 0.94 mg/dL (ref 0.40–1.20)
GFR: 61.96 mL/min (ref >60.00)
Glucose, Bld: 97 mg/dL (ref 70–99)
Potassium: 4.6 mEq/L (ref 3.5–5.1)
Sodium: 136 mEq/L (ref 135–145)
Total Bilirubin: 0.5 mg/dL (ref 0.2–1.2)
Total Protein: 7 g/dL (ref 6.0–8.3)

## 2023-01-10 LAB — HEMOGLOBIN A1C: Hgb A1c MFr Bld: 5.5 % (ref 4.6–6.5)

## 2023-01-10 NOTE — Assessment & Plan Note (Signed)
This is being managed by GYN.  

## 2023-01-10 NOTE — Patient Instructions (Signed)
VISIT SUMMARY:  During your annual physical, you mentioned feeling tired despite getting plenty of sleep. You have no specific health concerns and are considering starting a ketogenic diet for weight loss. You are currently doing yoga and walking for exercise. You have a history of thyroid issues and are taking Synthroid, calcium with vitamin D, and additional vitamin D and E supplements. Your blood pressure was slightly elevated, but there are no changes in your medication or symptoms. You are overdue for a tetanus vaccine (which you can request at your pharmacy) and your mammogram is due in February 2023.  YOUR PLAN:  -HYPERTENSION: Your blood pressure was slightly high. Hypertension is a condition where the force of the blood against the artery walls is too high. It's important to continue monitoring your blood pressure.  -OSTEOPENIA: You have osteopenia, which is a condition where bone density is lower than normal. Continue taking your calcium and vitamin D supplements and doing weight-bearing exercises.  -IMMUNIZATIONS: You are overdue for a tetanus vaccine, which protects against a serious bacterial infection. Please get this vaccine at your pharmacy. Continue getting your annual flu shots at work.  -PREVENTIVE HEALTH: We will order a metabolic panel and A1c to screen for diabetes, a condition that affects how your body uses blood sugar. We will also order a mammogram, which is an X-ray of the breast used to detect breast diseases.  -WEIGHT MANAGEMENT: You're considering a ketogenic diet for weight loss. It's important to maintain a healthy diet and regular exercise.  INSTRUCTIONS:  Please obtain a tetanus vaccine at your pharmacy. We will order a metabolic panel and A1c to screen for diabetes and a mammogram to be done at [facility] on [address]. Continue your current management and annual physicals.

## 2023-01-10 NOTE — Progress Notes (Signed)
Subjective:     Patient ID: Christina Russo, female    DOB: June 26, 1955, 67 y.o.   MRN: [id number]  Chief Complaint  Patient presents with   Annual Exam    HPI  Discussed the use of AI scribe software for clinical note transcription with the patient, who gave verbal consent to proceed.  History of Present Illness   The patient, an employee of [employer], presents for an annual physical. She denies any specific health concerns. She is considering starting a ketogenic diet to lose weight and is currently engaging in yoga and walking for exercise. She has a history of thyroid issues and is on Synthroid. She also takes calcium with vitamin D and an additional vitamin D and E supplement. She has a family history of diabetes and her sugar was slightly elevated at last check. She denies any current cough, cold symptoms, skin issues, hearing or vision concerns, swelling in the legs, chest pain, shortness of breath, digestive concerns, urinary issues, unusual muscle pain, joint pain, frequent headaches, and concerns about depression or anxiety.     Immunizations: due for flu shot will get at work, declines covid vaccines Diet: fair, trying keto Exercise:  yoga and walking Colonoscopy: 04/15/2017 Dexa: 12/31/21, working on calcium supplement and walking Pap Smear: N/A Mammogram: 07/13/21 Vision: up to date Dental: up to date  Hypothyroid- maintained on synthroid 88 mcg.  Lab Results  Component Value Date   TSH 1.17 12/14/2022      Health Maintenance Due  Topic Date Due   COVID-19 Vaccine (1 - 2023-24 season) Never done   DTaP/Tdap/Td (2 - Td or Tdap) 06/08/2022   INFLUENZA VACCINE  12/16/2022    Past Medical History:  Diagnosis Date   Allergy    Heart murmur    "slight / intermittent"   Hypothyroidism    followed by pcp   Osteopenia    Prolapse of vaginal walls    anterior ,  posterior, and vaginal vault   Wears contact lenses    Wears partial dentures    upper     Past Surgical History:  Procedure Laterality Date   COLONOSCOPY  2018   CYSTOSCOPY N/A 09/30/2021   Procedure: CYSTOSCOPY;  Surgeon: [name], MD;  Location: [facility];  Service: Gynecology;  Laterality: N/A;   ROBOTIC ASSISTED LAPAROSCOPIC SACROCOLPOPEXY N/A 09/30/2021   Procedure: XI ROBOTIC ASSISTED LAPAROSCOPIC SACROCOLPOPEXY;  Surgeon: [name], MD;  Location: [facility];  Service: Gynecology;  Laterality: N/A;  total time requested is 3 hours   TUBAL LIGATION Bilateral    yrs ago   VAGINAL HYSTERECTOMY  03/2001   @[facility]     Family History  Problem Relation Age of Onset   Diabetes Mother    Hypertension Mother    CVA Mother    Stroke Mother    Diabetes Father    Congestive Heart Failure Father    Hypertension Father    Heart attack Father    Heart disease Father    Glaucoma Sister    Blindness Sister    Heart attack Brother    Stroke Maternal Grandmother    Blindness Sister    Blindness Brother    Heart disease Brother    Diabetes Brother    Drug abuse Son    Blindness Brother    Drug abuse Brother    Colon cancer Neg Hx    Esophageal cancer Neg Hx    Pancreatic cancer Neg Hx  Rectal cancer Neg Hx    Stomach cancer Neg Hx     Social History   Socioeconomic History   Marital status: Divorced    Spouse name: Not on file   Number of children: Not on file   Years of education: Not on file   Highest education level: Not on file  Occupational History   Not on file  Tobacco Use   Smoking status: Former    Current packs/day: 0.00    Types: Cigarettes    Start date: 27    Quit date: 30    Years since quitting: 37.6   Smokeless tobacco: Never  Vaping Use   Vaping status: Never Used  Substance and Sexual Activity   Alcohol use: Yes    Alcohol/week: 7.0 standard drinks of alcohol    Types: 7 Glasses of wine per week    Comment: one daily wine   Drug use: Never   Sexual activity: Not  Currently    Birth control/protection: Surgical    Comment: Hyst, First IC >16 y/o, <5 Partners, DES-neg, No Hx of STIs  Other Topics Concern   Not on file  Social History Narrative   Works in Producer, television/film/video at [employer]   Divorced   3 children and 9 grandchildren and 1 great grandchild   Her children live locally   Enjoys reading, cooking, gardening, dance, music   Social Determinants of Health   Financial Resource Strain: Low Risk  (12/14/2022)   Overall Financial Resource Strain (CARDIA)    Difficulty of Paying Living Expenses: Not hard at all  Food Insecurity: No Food Insecurity (12/14/2022)   Hunger Vital Sign    Worried About Running Out of Food in the Last Year: Never true    Ran Out of Food in the Last Year: Never true  Transportation Needs: No Transportation Needs (12/14/2022)   PRAPARE - Administrator, Civil Service (Medical): No    Lack of Transportation (Non-Medical): No  Physical Activity: Insufficiently Active (12/14/2022)   Exercise Vital Sign    Days of Exercise per Week: 3 days    Minutes of Exercise per Session: 20 min  Stress: No Stress Concern Present (12/14/2022)   [employer] of Occupational Health - Occupational Stress Questionnaire    Feeling of Stress : Not at all  Social Connections: Socially Isolated (12/14/2022)   Social Connection and Isolation Panel [NHANES]    Frequency of Communication with Friends and Family: Three times a week    Frequency of Social Gatherings with Friends and Family: Three times a week    Attends Religious Services: Never    Active Member of Clubs or Organizations: No    Attends Banker Meetings: Never    Marital Status: Divorced  Catering manager Violence: Not At Risk (12/14/2022)   Humiliation, Afraid, Rape, and Kick questionnaire    Fear of Current or Ex-Partner: No    Emotionally Abused: No    Physically Abused: No    Sexually Abused: No    Outpatient Medications Prior to Visit   Medication Sig Dispense Refill   Calcium Carbonate-Vitamin D 600-400 MG-UNIT chew tablet Chew 1 tablet by mouth 2 (two) times daily.     Cholecalciferol (VITAMIN D3) 1000 units CAPS Take by mouth daily.     Docusate Calcium (STOOL SOFTENER PO) Take by mouth 2 (two) times daily as needed.     estradiol (ESTRACE) 0.5 MG tablet Take 1 tablet (0.5 mg total) by mouth daily. 90 tablet  3   levothyroxine (SYNTHROID) 88 MCG tablet Take 1 tablet (88 mcg total) by mouth daily before breakfast. 90 tablet 1   Melatonin 3 MG TABS Take 2 tablets by mouth at bedtime.     NONFORMULARY OR COMPOUNDED ITEM CASCARA SAGRADA 1/2 TAB PO QD     progesterone (PROMETRIUM) 100 MG capsule TAKE ONE CAPSULE BY MOUTH DAILY AT BEDTIME 90 capsule 3   No facility-administered medications prior to visit.    Allergies  Allergen Reactions   Codeine Nausea And Vomiting   Penicillins Rash    Per pt childhood allergy reaction,  has taken similar medications without any issue's    Review of Systems  HENT:  Negative for congestion and hearing loss.   Eyes:  Negative for blurred vision.  Respiratory:  Negative for cough and shortness of breath.   Cardiovascular:  Negative for chest pain and leg swelling.  Gastrointestinal:  Negative for constipation and diarrhea.  Genitourinary:  Negative for dysuria, frequency and hematuria.  Musculoskeletal:  Negative for joint pain and myalgias.  Skin:  Negative for rash.  Neurological:  Negative for headaches.  Psychiatric/Behavioral:         Denies depression/anxiety       Objective:    Physical Exam   BP 136/82   Pulse 69   Temp 98 F (36.7 C) (Oral)   Resp 16   Ht 5\' 1"  (1.549 m)   Wt 138 lb (62.6 kg)   SpO2 99%   BMI 26.07 kg/m  Wt Readings from Last 3 Encounters:  01/10/23 138 lb (62.6 kg)  12/14/22 136 lb 3.2 oz (61.8 kg)  05/25/22 134 lb (60.8 kg)   BP Readings from Last 3 Encounters:  01/10/23 136/82  12/14/22 126/78  05/25/22 92/62   Physical Exam   Constitutional: She is oriented to person, place, and time. She appears well-developed and well-nourished. No distress.  HENT:  Head: Normocephalic and atraumatic.  Right Ear: Tympanic membrane and ear canal normal.  Left Ear: Tympanic membrane and ear canal normal.  Mouth/Throat: Oropharynx is clear and moist.  Eyes: Pupils are equal, round, and reactive to light. No scleral icterus.  Neck: Normal range of motion. No thyromegaly present.  Cardiovascular: Normal rate and regular rhythm.   No murmur heard. Pulmonary/Chest: Effort normal and breath sounds normal. No respiratory distress. He has no wheezes. She has no rales. She exhibits no tenderness.  Abdominal: Soft. Bowel sounds are normal. She exhibits no distension and no mass. There is no tenderness. There is no rebound and no guarding.  Musculoskeletal: She exhibits no edema.  Lymphadenopathy:    She has no cervical adenopathy.  Neurological: She is alert and oriented to person, place, and time. She has normal patellar reflexes. She exhibits normal muscle tone. Coordination normal.  Skin: Skin is warm and dry.  Psychiatric: She has a normal mood and affect. Her behavior is normal. Judgment and thought content normal.  Breast/pelvic: deferred          Assessment & Plan:        Assessment & Plan:   Problem List Items Addressed This Visit       Unprioritized   Preventative health care - Primary     Immunizations Tetanus vaccine overdue since January 2024. Patient completed shingles series and pneumonia vaccine. Flu shot to be obtained at work. -declines covid vaccination -Obtain tetanus vaccine at pharmacy. -Continue annual flu shots at work.  Last colonoscopy in 2018, due for next in 2028. Bone  density checked last year. Mammogram due  -Order mammogram to be done at [facility] on [address].  -Order metabolic panel and A1c to screen for diabetes.  Weight Management Patient is considering a ketogenic diet  for weight loss. Regularly engages in yoga and walking. -Encourage healthy diet and regular exercise.       Osteoporosis without current pathological fracture    Continues calcium/vit D and weight bearing exercise. Declines fosamax.      Hypothyroidism    Lab Results  Component Value Date   TSH 1.17 12/14/2022   TSH is stable, continue current dose of synthroid.      Hormone replacement therapy (HRT)    This is being managed by GYN.       Other Visit Diagnoses     Hyperglycemia       Relevant Orders   Comp Met (CMET)   HgB A1c   Breast cancer screening by mammogram       Relevant Orders   MM 3D SCREENING MAMMOGRAM BILATERAL BREAST       I am having Christina Russo maintain her melatonin, NONFORMULARY OR COMPOUNDED ITEM, Vitamin D3, Docusate Calcium (STOOL SOFTENER PO), Calcium Carbonate-Vitamin D, estradiol, progesterone, and levothyroxine.  No orders of the defined types were placed in this encounter.

## 2023-01-10 NOTE — Assessment & Plan Note (Signed)
  Immunizations Tetanus vaccine overdue since January 2024. Patient completed shingles series and pneumonia vaccine. Flu shot to be obtained at work. -declines covid vaccination -Obtain tetanus vaccine at pharmacy. -Continue annual flu shots at work.  Last colonoscopy in 2018, due for next in 2028. Bone density checked last year. Mammogram due  -Order mammogram to be done at [facility] on [address].  -Order metabolic panel and A1c to screen for diabetes.  Weight Management Patient is considering a ketogenic diet for weight loss. Regularly engages in yoga and walking. -Encourage healthy diet and regular exercise.

## 2023-01-10 NOTE — Assessment & Plan Note (Signed)
Lab Results  Component Value Date   TSH 1.17 12/14/2022   TSH is stable, continue current dose of synthroid.

## 2023-01-10 NOTE — Assessment & Plan Note (Signed)
Continues calcium/vit D and weight bearing exercise. Declines fosamax.

## 2023-04-28 DIAGNOSIS — Z0100 Encounter for examination of eyes and vision without abnormal findings: Secondary | ICD-10-CM | POA: Diagnosis not present

## 2023-05-28 ENCOUNTER — Other Ambulatory Visit: Payer: Self-pay | Admitting: Nurse Practitioner

## 2023-05-28 ENCOUNTER — Other Ambulatory Visit: Payer: Self-pay | Admitting: Family

## 2023-05-28 DIAGNOSIS — E038 Other specified hypothyroidism: Secondary | ICD-10-CM

## 2023-05-28 DIAGNOSIS — Z79890 Hormone replacement therapy (postmenopausal): Secondary | ICD-10-CM

## 2023-05-30 NOTE — Telephone Encounter (Signed)
 Med refill request: estrace Last AEX:05/14/21, Last OV: 05/25/22 Next AEX: 06/29/23 Last MMG (if hormonal med) 07/13/21 Refill authorized: Please Advise, #[phone]

## 2023-06-15 ENCOUNTER — Encounter: Payer: Self-pay | Admitting: Family

## 2023-06-15 DIAGNOSIS — M810 Age-related osteoporosis without current pathological fracture: Secondary | ICD-10-CM

## 2023-06-23 DIAGNOSIS — Z1231 Encounter for screening mammogram for malignant neoplasm of breast: Secondary | ICD-10-CM | POA: Diagnosis not present

## 2023-06-23 LAB — HM MAMMOGRAPHY

## 2023-06-28 ENCOUNTER — Other Ambulatory Visit: Payer: Self-pay | Admitting: Nurse Practitioner

## 2023-06-28 DIAGNOSIS — Z79890 Hormone replacement therapy (postmenopausal): Secondary | ICD-10-CM

## 2023-06-29 ENCOUNTER — Ambulatory Visit: Payer: Medicare HMO | Admitting: Nurse Practitioner

## 2023-06-29 ENCOUNTER — Encounter: Payer: Self-pay | Admitting: Nurse Practitioner

## 2023-06-29 VITALS — BP 124/74 | HR 68 | Ht 61.0 in | Wt 135.6 lb

## 2023-06-29 DIAGNOSIS — Z01419 Encounter for gynecological examination (general) (routine) without abnormal findings: Secondary | ICD-10-CM

## 2023-06-29 DIAGNOSIS — Z79890 Hormone replacement therapy (postmenopausal): Secondary | ICD-10-CM | POA: Diagnosis not present

## 2023-06-29 DIAGNOSIS — M810 Age-related osteoporosis without current pathological fracture: Secondary | ICD-10-CM

## 2023-06-29 MED ORDER — PROGESTERONE MICRONIZED 100 MG PO CAPS: ORAL_CAPSULE | ORAL | 3 refills | Status: AC

## 2023-06-29 MED ORDER — ESTRADIOL 0.5 MG PO TABS: 0.5000 mg | ORAL_TABLET | Freq: Every day | ORAL | 3 refills | Status: AC

## 2023-06-29 NOTE — Telephone Encounter (Signed)
Med refill request: progesterone 100 mg Last OV: 05/25/22 medication management Next AEX: 06/29/23 Last MMG (if hormonal med) 07/10/21 normal Refill authorized: last RF 05/21/23 progesterone 100 mg #30.  Please approve or deny as appropriate.

## 2023-06-29 NOTE — Progress Notes (Signed)
Christina Russo November 24, 1955 [id number]   History:  68 y.o. G3P0003 presents for breast and pelvic exam. Postmenopausal - on HRT. Switched from patch to pill form a couple of years ago due to costs. Has tried to wean and dose not tolerate. S/P 2002 TVH.  Normal pap history. History of hypothyroidism and osteoporosis - declines treatment. 09/2021 sacrocolpopexy for vaginal/rectal prolapse.   Gynecologic History No LMP recorded. Patient has had a hysterectomy.   Contraception: status post hysterectomy  Health maintenance Last Pap: No longer screening per guidelines Last mammogram: 06/23/2023. Results were: Normal Last colonoscopy: 04/15/2017. Results were: Normal, 10-year recall Last Dexa: 12/31/2021. Results were: T-score -2.6  Past medical history, past surgical history, family history and social history were all reviewed and documented in the EPIC chart. Works for medication management doing research. Watches her grandkids ages 38 and 62 on the weekends.   ROS:  A ROS was performed and pertinent positives and negatives are included.  Exam:  Vitals:   06/29/23 0904  BP: 124/74  Pulse: 68  SpO2: 94%  Weight: 135 lb 9.6 oz (61.5 kg)  Height: 5\' 1"  (1.549 m)     Body mass index is 25.62 kg/m.  General appearance:  Normal Thyroid:  Symmetrical, normal in size, without palpable masses or nodularity. Respiratory  Auscultation:  Clear without wheezing or rhonchi Cardiovascular  Auscultation:  Regular rate, without rubs, murmurs or gallops  Edema/varicosities:  Not grossly evident Abdominal  Soft,nontender, without masses, guarding or rebound.  Liver/spleen:  No organomegaly noted  Hernia:  None appreciated  Skin  Inspection:  Grossly normal   Breasts: Examined lying and sitting.   Right: Without masses, retractions, discharge or axillary adenopathy.   Left: Without masses, retractions, discharge or axillary adenopathy. Pelvic: External genitalia:  no lesions               Urethra:  normal appearing urethra with no masses, tenderness or lesions              Bartholins and Skenes: normal                 Vagina: normal appearing vagina with normal color and discharge, no lesions              Cervix: absent Bimanual Exam:  Uterus: absent              Adnexa: no mass, fullness, tenderness              Rectovaginal: Deferred              Anus:  normal, no lesions  Patient informed chaperone available to be present for breast and pelvic exam. Patient has requested no chaperone to be present. Patient has been advised what will be completed during breast and pelvic exam.   Assessment/Plan:  68 y.o. G3P0003 for breast and pelvic exam.   Encounter for breast and pelvic examination - Education provided on SBEs, importance of preventative screenings, current guidelines, high calcium diet, regular exercise, and multivitamin daily. Labs with PCP.   Postmenopausal - On HRT. S/P 2002 TVH.   Hormone replacement therapy (HRT) - Plan: estradiol (ESTRACE) 0.5 MG tablet daily, progesterone (PROMETRIUM) 100 MG capsule nightly. She has tried to wean and did not tolerate. She is aware of risks of blood clots, heart attack, stroke, and breast cancer with continued use. Refill x 1 year provided.   Osteopenia of multiple sites - 12/2021 T score -2.6.  Declines treatment. She is  taking daily vitamin D supplement and exercising regularly.    Screening for cervical cancer - Normal Pap history.  No longer screening per guidelines.  Screening for breast cancer -  Continue annual screenings. UTD. Normal breast exam today.  Screening for colon cancer - 2018 colonoscopy.  Will repeat at 10-year interval per GI's recommendation.   Return in about 1 year (around 06/28/2024) for Med follow up.     Olivia Mackie [facility], 9:25 AM 06/29/2023

## 2023-11-30 ENCOUNTER — Other Ambulatory Visit: Payer: Self-pay | Admitting: Family

## 2023-11-30 DIAGNOSIS — E038 Other specified hypothyroidism: Secondary | ICD-10-CM

## 2023-12-28 ENCOUNTER — Encounter

## 2024-01-31 ENCOUNTER — Other Ambulatory Visit: Payer: Self-pay

## 2024-01-31 DIAGNOSIS — E038 Other specified hypothyroidism: Secondary | ICD-10-CM

## 2024-01-31 MED ORDER — LEVOTHYROXINE SODIUM 88 MCG PO TABS: 88.0000 ug | ORAL_TABLET | Freq: Every day | ORAL | 0 refills | Status: DC

## 2024-02-01 ENCOUNTER — Encounter: Admitting: Family

## 2024-02-02 ENCOUNTER — Telehealth: Payer: Self-pay | Admitting: Family

## 2024-02-02 NOTE — Telephone Encounter (Signed)
 Copied from CRM [id number]. Topic: Medicare AWV >> Feb 02, 2024 11:39 AM Nathanel DEL wrote: Reason for CRM: Called LVM 02/02/2024 to schedule AWV. Please schedule Virtual or Telehealth visits ONLY.   Nathanel Paschal; Care Guide Ambulatory Clinical Support [facility] Direct Dial: [phone]

## 2024-02-03 ENCOUNTER — Encounter: Payer: Self-pay | Admitting: *Deleted

## 2024-02-03 ENCOUNTER — Ambulatory Visit (INDEPENDENT_AMBULATORY_CARE_PROVIDER_SITE_OTHER): Admitting: Family

## 2024-02-03 ENCOUNTER — Encounter: Payer: Self-pay | Admitting: Family

## 2024-02-03 ENCOUNTER — Telehealth: Payer: Self-pay | Admitting: Family

## 2024-02-03 VITALS — BP 138/71 | HR 84 | Temp 98.2°F | Ht 61.0 in | Wt 136.8 lb

## 2024-02-03 DIAGNOSIS — Z0000 Encounter for general adult medical examination without abnormal findings: Secondary | ICD-10-CM | POA: Diagnosis not present

## 2024-02-03 DIAGNOSIS — Z13220 Encounter for screening for lipoid disorders: Secondary | ICD-10-CM

## 2024-02-03 DIAGNOSIS — E039 Hypothyroidism, unspecified: Secondary | ICD-10-CM | POA: Diagnosis not present

## 2024-02-03 DIAGNOSIS — M810 Age-related osteoporosis without current pathological fracture: Secondary | ICD-10-CM

## 2024-02-03 DIAGNOSIS — Z1231 Encounter for screening mammogram for malignant neoplasm of breast: Secondary | ICD-10-CM

## 2024-02-03 NOTE — Telephone Encounter (Signed)
Mammo abstracted  

## 2024-02-03 NOTE — Progress Notes (Signed)
 Subjective:     Patient ID: Christina Russo, female    DOB: [birth year], 68 y.o.   MRN: [id number]  Chief Complaint  Patient presents with   Annual Exam    HPI  Discussed the use of AI scribe software for clinical note transcription with the patient, who gave verbal consent to proceed.  History of Present Illness  Christina Russo is a 68 year old female who presents for a routine annual physical exam.  She takes Synthroid  for thyroid  management and has a 90-day supply. She also receives estrogen and progesterone  from her gynecologist. She manages osteoporosis with a calcium  supplement and had a bone density scan on December 31, 2021. Her physical activity has decreased due to home maintenance issues.  Her diet is healthy, with most meals cooked at home. She consumes one to two glasses of wine daily, occasionally more on Fridays, and denies drug use. Her last mammogram was in 2023, and she prefers [facility] for future screenings due to past billing issues.  She denies symptoms such as cough, cold, skin concerns, hearing or vision issues, leg swelling, digestive or urinary issues, unusual muscle or joint pain, frequent headaches, or mental health concerns. She is up to date on vision and dental check-ups.   Immunizations: flu will get at work Diet: healthy Exercise: regularly Colonoscopy: due 2028 Dexa: due Pap Smear: aged out Mammogram:due, ordered Vision: up to date Dental: up to date      Health Maintenance Due  Topic Date Due   DTaP/Tdap/Td (2 - Td or Tdap) 06/08/2022   Medicare Annual Wellness (AWV)  12/14/2023   COVID-19 Vaccine (1 - 2024-25 season) Never done    Past Medical History:  Diagnosis Date   Allergy    Heart murmur    slight / intermittent   Hypothyroidism    followed by pcp   Osteopenia    Prolapse of vaginal walls    anterior ,  posterior, and vaginal vault   Wears contact lenses    Wears partial dentures    upper    Past Surgical History:   Procedure Laterality Date   COLONOSCOPY  2018   CYSTOSCOPY N/A 09/30/2021   Procedure: CYSTOSCOPY;  Surgeon: Marilynne Rosaline SAILOR, MD;  Location: [facility];  Service: Gynecology;  Laterality: N/A;   ROBOTIC ASSISTED LAPAROSCOPIC SACROCOLPOPEXY N/A 09/30/2021   Procedure: XI ROBOTIC ASSISTED LAPAROSCOPIC SACROCOLPOPEXY;  Surgeon: Marilynne Rosaline SAILOR, MD;  Location: [facility];  Service: Gynecology;  Laterality: N/A;  total time requested is 3 hours   TUBAL LIGATION Bilateral    yrs ago   VAGINAL HYSTERECTOMY  03/2001   @[facility]     Family History  Problem Relation Age of Onset   Diabetes Mother    Hypertension Mother    CVA Mother    Stroke Mother    Diabetes Father    Congestive Heart Failure Father    Hypertension Father    Heart attack Father    Heart disease Father    Glaucoma Sister    Blindness Sister    Heart attack Brother    Stroke Maternal Grandmother    Blindness Sister    Blindness Brother    Heart disease Brother    Diabetes Brother    Drug abuse Son    Blindness Brother    Drug abuse Brother    Colon cancer Neg Hx    Esophageal cancer Neg Hx    Pancreatic cancer Neg Hx  Rectal cancer Neg Hx    Stomach cancer Neg Hx     Social History   Socioeconomic History   Marital status: Divorced    Spouse name: Not on file   Number of children: Not on file   Years of education: Not on file   Highest education level: Associate degree: occupational, Scientist, product/process development, or vocational program  Occupational History   Not on file  Tobacco Use   Smoking status: Former    Current packs/day: 0.00    Types: Cigarettes    Start date: 46    Quit date: 1987    Years since quitting: 38.7    Passive exposure: Never   Smokeless tobacco: Never  Vaping Use   Vaping status: Never Used  Substance and Sexual Activity   Alcohol use: Yes    Alcohol/week: 7.0 standard drinks of alcohol    Types: 7 Glasses of wine per week    Comment: one daily  wine   Drug use: Never   Sexual activity: Not Currently    Partners: Male    Birth control/protection: Surgical    Comment: Hyst, First IC >16 y/o, <5 Partners, DES-neg, No Hx of STIs  Other Topics Concern   Not on file  Social History Narrative   Works in Producer, television/film/video at [employer]   Divorced   3 children and 9 grandchildren and 1 great grandchild   Her children live locally   Enjoys reading, cooking, gardening, dance, music   Social Drivers of Health   Financial Resource Strain: Low Risk  (12/21/2023)   Overall Financial Resource Strain (CARDIA)    Difficulty of Paying Living Expenses: Not hard at all  Food Insecurity: No Food Insecurity (12/21/2023)   Hunger Vital Sign    Worried About Running Out of Food in the Last Year: Never true    Ran Out of Food in the Last Year: Never true  Transportation Needs: No Transportation Needs (12/21/2023)   PRAPARE - Administrator, Civil Service (Medical): No    Lack of Transportation (Non-Medical): No  Physical Activity: Insufficiently Active (12/21/2023)   Exercise Vital Sign    Days of Exercise per Week: 3 days    Minutes of Exercise per Session: 30 min  Stress: No Stress Concern Present (12/21/2023)   [employer] of Occupational Health - Occupational Stress Questionnaire    Feeling of Stress: Not at all  Social Connections: Moderately Integrated (12/21/2023)   Social Connection and Isolation Panel    Frequency of Communication with Friends and Family: More than three times a week    Frequency of Social Gatherings with Friends and Family: Three times a week    Attends Religious Services: More than 4 times per year    Active Member of Clubs or Organizations: Yes    Attends Banker Meetings: More than 4 times per year    Marital Status: Divorced  Intimate Partner Violence: Not At Risk (12/14/2022)   Humiliation, Afraid, Rape, and Kick questionnaire    Fear of Current or Ex-Partner: No    Emotionally Abused: No     Physically Abused: No    Sexually Abused: No    Outpatient Medications Prior to Visit  Medication Sig Dispense Refill   Calcium  Carbonate-Vitamin D  600-400 MG-UNIT chew tablet Chew 1 tablet by mouth 2 (two) times daily.     Cholecalciferol (VITAMIN D3) 1000 units CAPS Take by mouth daily.     Docusate Calcium  (STOOL SOFTENER PO) Take by mouth 2 (  two) times daily as needed.     estradiol  (ESTRACE ) 0.5 MG tablet Take 1 tablet (0.5 mg total) by mouth daily. 90 tablet 3   levothyroxine  (SYNTHROID ) 88 MCG tablet Take 1 tablet (88 mcg total) by mouth daily before breakfast. Need follow up with PCP for future refills 90 tablet 0   Melatonin 3 MG TABS Take 2 tablets by mouth at bedtime.     NONFORMULARY OR COMPOUNDED ITEM CASCARA SAGRADA 1/2 TAB PO QD     progesterone  (PROMETRIUM ) 100 MG capsule TAKE ONE CAPSULE BY MOUTH DAILY AT BEDTIME 90 capsule 3   No facility-administered medications prior to visit.    Allergies  Allergen Reactions   Codeine Nausea And Vomiting   Penicillins Rash    Per pt childhood allergy reaction,  has taken similar medications without any issue's    Review of Systems  Constitutional:  Negative for weight loss.  HENT:  Negative for congestion and hearing loss.   Eyes:  Negative for blurred vision.  Respiratory:  Negative for cough.   Cardiovascular:  Negative for leg swelling.  Gastrointestinal:  Negative for constipation and diarrhea.  Genitourinary:  Negative for dysuria and frequency.  Musculoskeletal:  Negative for joint pain and myalgias.  Skin:  Negative for rash.  Neurological:  Negative for headaches.  Psychiatric/Behavioral:  Negative for depression. The patient is not nervous/anxious.    Lab Results  Component Value Date   CHOL 178 05/08/2019   HDL 102 05/08/2019   LDLCALC 63 05/08/2019   TRIG 57 05/08/2019   CHOLHDL 1.7 05/08/2019        Objective:    Physical Exam   BP 138/71 (BP Location: Right Arm, Patient Position: Sitting, Cuff  Size: Normal)   Pulse 84   Temp 98.2 F (36.8 C) (Oral)   Ht 5' 1 (1.549 m)   Wt 136 lb 12.8 oz (62.1 kg)   SpO2 98%   BMI 25.85 kg/m  Wt Readings from Last 3 Encounters:  02/03/24 136 lb 12.8 oz (62.1 kg)  06/29/23 135 lb 9.6 oz (61.5 kg)  01/10/23 138 lb (62.6 kg)   Physical Exam  Constitutional: She is oriented to person, place, and time. She appears well-developed and well-nourished. No distress.  HENT:  Head: Normocephalic and atraumatic.  Right Ear: Tympanic membrane and ear canal normal.  Left Ear: Tympanic membrane and ear canal normal.  Mouth/Throat: Oropharynx is clear and moist.  Eyes: Pupils are equal, round, and reactive to light. No scleral icterus.  Neck: Normal range of motion. No thyromegaly present.  Cardiovascular: Normal rate and regular rhythm.   No murmur heard. Pulmonary/Chest: Effort normal and breath sounds normal. No respiratory distress. He has no wheezes. She has no rales. She exhibits no tenderness.  Abdominal: Soft. Bowel sounds are normal. She exhibits no distension and no mass. There is no tenderness. There is no rebound and no guarding.  Musculoskeletal: She exhibits no edema.  Lymphadenopathy:    She has no cervical adenopathy.  Neurological: She is alert and oriented to person, place, and time. She has normal patellar reflexes. She exhibits normal muscle tone. Coordination normal.  Skin: Skin is warm and dry.  Psychiatric: She has a normal mood and affect. Her behavior is normal. Judgment and thought content normal.      Assessment & Plan:       Assessment & Plan:   Problem List Items Addressed This Visit       Unprioritized   Preventative health care  Routine adult wellness visit with no acute concerns. - Schedule lab appointment for thyroid  check. - Schedule mammogram at [facility]. - Schedule bone density test at [facility]. - Check cholesterol and metabolic panel during next lab draw. - Administer flu vaccine at work. -  Administer tetanus vaccine at pharmacy or if injured.       Osteoporosis without current pathological fracture   Declines fosamax, continues calcium  exercise and estrogen from her GYN.       Hypothyroidism - Primary   Clinically stable on synthroid , continue same. Update TSH.       Relevant Orders   TSH   Comp Met (CMET)   Lipid panel   Other Visit Diagnoses       Osteoporosis, unspecified osteoporosis type, unspecified pathological fracture presence       Relevant Orders   DG Bone Density     Breast cancer screening by mammogram       Relevant Orders   MM 3D SCREENING MAMMOGRAM BILATERAL BREAST     Screening for lipoid disorders           I am having Dagoberto CHARLENA Amabile maintain her melatonin, NONFORMULARY OR COMPOUNDED ITEM, Vitamin D3, Docusate Calcium  (STOOL SOFTENER PO), Calcium  Carbonate-Vitamin D , estradiol , progesterone , and levothyroxine .  No orders of the defined types were placed in this encounter.

## 2024-02-03 NOTE — Telephone Encounter (Signed)
 Can you please abstract mammo from [facility].

## 2024-02-05 NOTE — Assessment & Plan Note (Signed)
 Declines fosamax, continues calcium  exercise and estrogen from her GYN.

## 2024-02-05 NOTE — Assessment & Plan Note (Signed)
  Routine adult wellness visit with no acute concerns. - Schedule lab appointment for thyroid  check. - Schedule mammogram at [facility]. - Schedule bone density test at [facility]. - Check cholesterol and metabolic panel during next lab draw. - Administer flu vaccine at work. - Administer tetanus vaccine at pharmacy or if injured.

## 2024-02-05 NOTE — Patient Instructions (Signed)
 VISIT SUMMARY:  Today, you had your routine annual physical exam. We discussed your current medications, recent tests, and overall health. You are doing well with no acute concerns.  YOUR PLAN:  ADULT WELLNESS VISIT: Routine adult wellness visit with no acute concerns. -Schedule a lab appointment for a thyroid  check. -Schedule a mammogram at [facility]. -Schedule a bone density test at [facility]. -Check cholesterol and metabolic panel during your next lab draw. -Get a flu vaccine at work. -Get a tetanus vaccine at the pharmacy or if you get injured.  HYPOTHYROIDISM: You have hypothyroidism and are currently taking Synthroid . -Continue taking Synthroid  as prescribed. -We will order a thyroid  function test.  OSTEOPOROSIS: You are managing osteoporosis with calcium  supplements and regular exercise. -Continue taking your calcium  supplements. -Keep up with regular exercise and maintain a healthy diet.

## 2024-02-05 NOTE — Assessment & Plan Note (Signed)
 Clinically stable on synthroid , continue same. Update TSH.

## 2024-02-08 ENCOUNTER — Other Ambulatory Visit (INDEPENDENT_AMBULATORY_CARE_PROVIDER_SITE_OTHER)

## 2024-02-08 DIAGNOSIS — E039 Hypothyroidism, unspecified: Secondary | ICD-10-CM

## 2024-02-08 LAB — COMPREHENSIVE METABOLIC PANEL WITH GFR
ALT: 14 U/L (ref 0–35)
AST: 16 U/L (ref 0–37)
Albumin: 4.4 g/dL (ref 3.5–5.2)
Alkaline Phosphatase: 57 U/L (ref 39–117)
BUN: 15 mg/dL (ref 6–23)
CO2: 29 meq/L (ref 19–32)
Calcium: 9.3 mg/dL (ref 8.4–10.5)
Chloride: 103 meq/L (ref 96–112)
Creatinine, Ser: 0.88 mg/dL (ref 0.40–1.20)
GFR: 66.56 mL/min (ref >60.00)
Glucose, Bld: 97 mg/dL (ref 70–99)
Potassium: 4.3 meq/L (ref 3.5–5.1)
Sodium: 140 meq/L (ref 135–145)
Total Bilirubin: 0.4 mg/dL (ref 0.2–1.2)
Total Protein: 6.8 g/dL (ref 6.0–8.3)

## 2024-02-08 LAB — LIPID PANEL
Cholesterol: 190 mg/dL (ref 0–200)
HDL: 96.2 mg/dL (ref >39.00)
LDL Cholesterol: 76 mg/dL (ref 0–99)
NonHDL: 93.34
Total CHOL/HDL Ratio: 2
Triglycerides: 85 mg/dL (ref 0.0–149.0)
VLDL: 17 mg/dL (ref 0.0–40.0)

## 2024-02-08 LAB — TSH: TSH: 0.96 u[IU]/mL (ref 0.35–5.50)

## 2024-02-09 ENCOUNTER — Ambulatory Visit: Payer: Self-pay | Admitting: Family

## 2024-02-22 ENCOUNTER — Ambulatory Visit (INDEPENDENT_AMBULATORY_CARE_PROVIDER_SITE_OTHER): Admitting: *Deleted

## 2024-02-22 ENCOUNTER — Telehealth: Payer: Self-pay | Admitting: *Deleted

## 2024-02-22 VITALS — Ht 61.0 in | Wt 136.0 lb

## 2024-02-22 DIAGNOSIS — Z0000 Encounter for general adult medical examination without abnormal findings: Secondary | ICD-10-CM

## 2024-02-22 DIAGNOSIS — Z8619 Personal history of other infectious and parasitic diseases: Secondary | ICD-10-CM

## 2024-02-22 MED ORDER — VALACYCLOVIR HCL 1 G PO TABS: ORAL_TABLET | ORAL | 1 refills | Status: AC

## 2024-02-22 NOTE — Telephone Encounter (Signed)
 Pt said she forgot to ask for a prescription at her last appt.  She has been given valtrex from a previous doctor for mouth ulcers she gets when she is stressed or has been to the dentist. She doesn't remember the strength and is asking for an Rx from PCP be sent to [facility].  Please advise?

## 2024-02-22 NOTE — Patient Instructions (Addendum)
 [name] , Thank you for taking time out of your busy schedule to complete your Annual Wellness Visit with me. I enjoyed our conversation and look forward to speaking with you again next year. I, as well as your care team,  appreciate your ongoing commitment to your health goals. Please review the following plan we discussed and let me know if I can assist you in the future. Your Game plan/ To Do List    Referrals: If you haven't heard from the office you've been referred to, please reach out to them at the phone provided.   Mammogram after 06/23/23:  [facility] Bone Density anytime now:  [facility]  Follow up Visits: Next Medicare AWV with our clinical staff:  02/27/25 3pm, telephone.  Next Office Visit with your provider: 02/06/25 8:20am, fasting physical.  Clinician Recommendations:  Aim for 30 minutes of exercise or brisk walking, 6-8 glasses of water , and 5 servings of fruits and vegetables each day.   You will need to get the following vaccines at your local pharmacy:  Tetanus      This is a list of the screening recommended for you and due dates:  Health Maintenance  Topic Date Due   DTaP/Tdap/Td vaccine (2 - Td or Tdap) 06/08/2022   Medicare Annual Wellness Visit  12/14/2023   COVID-19 Vaccine (1 - 2024-25 season) Never done   Flu Shot  08/14/2024*   Breast Cancer Screening  06/22/2025   Colon Cancer Screening  04/16/2027   Pneumococcal Vaccine for age over 61  Completed   DEXA scan (bone density measurement)  Completed   Hepatitis C Screening  Completed   Zoster (Shingles) Vaccine  Completed   Meningitis B Vaccine  Aged Out  *Topic was postponed. The date shown is not the original due date.    Advanced directives: (Copy Requested) Please bring a copy of your health care power of attorney and living will to the office to be added to your chart at your convenience. You can mail to [facility] [address] [place], [place] [address] or email to  [email or web address] Advance Care Planning is important because it:  [x]  Makes sure you receive the medical care that is consistent with your values, goals, and preferences  [x]  It provides guidance to your family and loved ones and reduces their decisional burden about whether or not they are making the right decisions based on your wishes.  Follow the link provided in your after visit summary or read over the paperwork we have mailed to you to help you started getting your Advance Directives in place. If you need assistance in completing these, please reach out to us  so that we can help you!  See attachments for Preventive Care and Fall Prevention Tips.

## 2024-02-22 NOTE — Progress Notes (Signed)
 Subjective:   Christina Russo is a 68 y.o. who presents for a Medicare Wellness preventive visit.  As a reminder, Annual Wellness Visits don't include a physical exam, and some assessments may be limited, especially if this visit is performed virtually. We may recommend an in-person follow-up visit with your provider if needed.  Visit Complete: Virtual I connected with  Christina Russo on 02/22/24 by a audio enabled telemedicine application and verified that I am speaking with the correct person using two identifiers.  Patient Location: Home  Provider Location: Office/Clinic  I discussed the limitations of evaluation and management by telemedicine. The patient expressed understanding and agreed to proceed.  Vital Signs: Because this visit was a virtual/telehealth visit, some criteria may be missing or patient reported. Any vitals not documented were not able to be obtained and vitals that have been documented are patient reported.  VideoDeclined- This patient declined Librarian, academic. Therefore the visit was completed with audio only.  Persons Participating in Visit: Patient.  AWV Questionnaire: No: Patient Medicare AWV questionnaire was not completed prior to this visit.  Cardiac Risk Factors include: advanced age (>50men, >36 women)     Objective:    Today's Vitals   02/22/24 1504  Weight: 136 lb (61.7 kg)  Height: 5' 1 (1.549 m)   Body mass index is 25.7 kg/m.     02/22/2024    3:09 PM 12/14/2022    8:26 AM 09/30/2021   10:11 AM  Advanced Directives  Does Patient Have a Medical Advance Directive? No No Yes  Type of Advance Directive   Healthcare Power of Attorney  Would patient like information on creating a medical advance directive? Yes (MAU/Ambulatory/Procedural Areas - Information given) No - Patient declined     Current Medications (verified) Outpatient Encounter Medications as of 02/22/2024  Medication Sig   Calcium   Carbonate-Vitamin D  600-400 MG-UNIT chew tablet Chew 1 tablet by mouth 2 (two) times daily.   Cholecalciferol (VITAMIN D3) 1000 units CAPS Take by mouth daily.   Docusate Calcium  (STOOL SOFTENER PO) Take by mouth 2 (two) times daily as needed.   estradiol  (ESTRACE ) 0.5 MG tablet Take 1 tablet (0.5 mg total) by mouth daily.   levothyroxine  (SYNTHROID ) 88 MCG tablet Take 1 tablet (88 mcg total) by mouth daily before breakfast. Need follow up with PCP for future refills   Melatonin 3 MG TABS Take 2 tablets by mouth at bedtime.   NONFORMULARY OR COMPOUNDED ITEM CASCARA SAGRADA 1/2 TAB PO QD   progesterone  (PROMETRIUM ) 100 MG capsule TAKE ONE CAPSULE BY MOUTH DAILY AT BEDTIME   No facility-administered encounter medications on file as of 02/22/2024.    Allergies (verified) Codeine and Penicillins   History: Past Medical History:  Diagnosis Date   Allergy    Heart murmur    slight / intermittent   Hypothyroidism    followed by pcp   Osteopenia    Prolapse of vaginal walls    anterior ,  posterior, and vaginal vault   Wears contact lenses    Wears partial dentures    upper   Past Surgical History:  Procedure Laterality Date   COLONOSCOPY  2018   CYSTOSCOPY N/A 09/30/2021   Procedure: CYSTOSCOPY;  Surgeon: Marilynne Rosaline SAILOR, MD;  Location: [facility];  Service: Gynecology;  Laterality: N/A;   ROBOTIC ASSISTED LAPAROSCOPIC SACROCOLPOPEXY N/A 09/30/2021   Procedure: XI ROBOTIC ASSISTED LAPAROSCOPIC SACROCOLPOPEXY;  Surgeon: Marilynne Rosaline SAILOR, MD;  Location: [facility];  Service: Gynecology;  Laterality: N/A;  total time requested is 3 hours   TUBAL LIGATION Bilateral    yrs ago   VAGINAL HYSTERECTOMY  03/2001   @[facility]    Family History  Problem Relation Age of Onset   Diabetes Mother    Hypertension Mother    CVA Mother    Stroke Mother    Diabetes Father    Congestive Heart Failure Father    Hypertension Father    Heart attack Father     Heart disease Father    Glaucoma Sister    Blindness Sister    Heart attack Brother    Stroke Maternal Grandmother    Blindness Sister    Blindness Brother    Heart disease Brother    Diabetes Brother    Drug abuse Son    Blindness Brother    Drug abuse Brother    Colon cancer Neg Hx    Esophageal cancer Neg Hx    Pancreatic cancer Neg Hx    Rectal cancer Neg Hx    Stomach cancer Neg Hx    Social History   Socioeconomic History   Marital status: Divorced    Spouse name: Not on file   Number of children: Not on file   Years of education: Not on file   Highest education level: Associate degree: occupational, Scientist, product/process development, or vocational program  Occupational History   Not on file  Tobacco Use   Smoking status: Former    Current packs/day: 0.00    Types: Cigarettes    Start date: 1982    Quit date: 1987    Years since quitting: 38.7    Passive exposure: Never   Smokeless tobacco: Never  Vaping Use   Vaping status: Never Used  Substance and Sexual Activity   Alcohol use: Yes    Alcohol/week: 7.0 standard drinks of alcohol    Types: 7 Glasses of wine per week    Comment: one daily wine   Drug use: Never   Sexual activity: Not Currently    Partners: Male    Birth control/protection: Surgical    Comment: Hyst, First IC >16 y/o, <5 Partners, DES-neg, No Hx of STIs  Other Topics Concern   Not on file  Social History Narrative   Works in Producer, television/film/video at [employer]   Divorced   3 children and 9 grandchildren and 1 great grandchild   Her children live locally   Enjoys reading, cooking, gardening, dance, music   Social Drivers of Health   Financial Resource Strain: Low Risk  (02/22/2024)   Overall Financial Resource Strain (CARDIA)    Difficulty of Paying Living Expenses: Not very hard  Food Insecurity: No Food Insecurity (02/22/2024)   Hunger Vital Sign    Worried About Running Out of Food in the Last Year: Never true    Ran Out of Food in the Last Year: Never true   Transportation Needs: No Transportation Needs (02/22/2024)   PRAPARE - Administrator, Civil Service (Medical): No    Lack of Transportation (Non-Medical): No  Physical Activity: Insufficiently Active (02/22/2024)   Exercise Vital Sign    Days of Exercise per Week: 3 days    Minutes of Exercise per Session: 30 min  Stress: Stress Concern Present (02/22/2024)   [employer] of Occupational Health - Occupational Stress Questionnaire    Feeling of Stress: To some extent  Social Connections: Moderately Integrated (02/22/2024)   Social Connection and Isolation Panel  Frequency of Communication with Friends and Family: More than three times a week    Frequency of Social Gatherings with Friends and Family: More than three times a week    Attends Religious Services: More than 4 times per year    Active Member of [employer] or Organizations: Yes    Attends Engineer, structural: More than 4 times per year    Marital Status: Divorced    Tobacco Counseling Counseling given: Not Answered    Clinical Intake:  Pre-visit preparation completed: Yes  Pain : No/denies pain     BMI - recorded: 25.7 Nutritional Status: BMI 25 -29 Overweight Nutritional Risks: None Diabetes: No  Lab Results  Component Value Date   HGBA1C 5.5 01/10/2023   HGBA1C 5.2 05/08/2019     How often do you need to have someone help you when you read instructions, pamphlets, or other written materials from your doctor or pharmacy?: 1 - Never  Interpreter Needed?: No  Information entered by :: Lolita Libra, CMA(AAMA)   Activities of Daily Living     02/22/2024    3:07 PM 12/21/2023   10:30 AM  In your present state of health, do you have any difficulty performing the following activities:  Hearing? 0 0  Vision? 0 0  Difficulty concentrating or making decisions? 0 0  Walking or climbing stairs? 0 0  Dressing or bathing? 0 0  Doing errands, shopping? 0 0  Preparing Food and eating ? N  N  Using the Toilet? N N  In the past six months, have you accidently leaked urine? N N  Do you have problems with loss of bowel control? N N  Managing your Medications? N N  Managing your Finances? N N  Housekeeping or managing your Housekeeping? N N    Patient Care Team: Daryl Setter, NP as PCP - General (Internal Medicine) Prentiss Annabella LABOR, NP as Nurse Practitioner (Gynecology)  I have updated your Care Teams any recent Medical Services you may have received from other providers in the past year.     Assessment:   This is a routine wellness examination for Sharline.  Hearing/Vision screen No results found.   Goals Addressed   None    Depression Screen     02/22/2024    3:09 PM 02/03/2024    4:32 PM 12/14/2022    8:25 AM 12/10/2021    7:32 AM 10/15/2020    8:37 AM 03/31/2018   11:08 AM 03/09/2017    8:04 AM  PHQ 2/9 Scores  PHQ - 2 Score 0 0 0 0 0 0 0  PHQ- 9 Score 0 0         Fall Risk     02/22/2024    3:33 PM 02/03/2024    4:31 PM 12/21/2023   10:30 AM 12/14/2022    8:21 AM 12/10/2021    7:32 AM  Fall Risk   Falls in the past year? 0 0 0 0 0  Number falls in past yr: 0 0  0 0  Injury with Fall? 0 0  0 0  Risk for fall due to : No Fall Risks No Fall Risks  No Fall Risks   Follow up Education provided Falls evaluation completed  Falls evaluation completed     MEDICARE RISK AT HOME:  Medicare Risk at Home Any stairs in or around the home?: No If so, are there any without handrails?: No Home free of loose throw rugs in walkways, pet beds, electrical  cords, etc?: Yes Adequate lighting in your home to reduce risk of falls?: Yes Life alert?: No Use of a cane, walker or w/c?: No Grab bars in the bathroom?: No Shower chair or bench in shower?: Yes Elevated toilet seat or a handicapped toilet?: No  TIMED UP AND GO:  Was the test performed?  No,audio  Cognitive Function: 6CIT completed        02/22/2024    3:10 PM 12/14/2022    8:27 AM  6CIT Screen   What Year? 0 points 0 points  What month? 0 points 0 points  What time? 0 points 0 points  Count back from 20 0 points 0 points  Months in reverse 0 points 0 points  Repeat phrase 0 points 0 points  Total Score 0 points 0 points    Immunizations Immunization History  Administered Date(s) Administered   Influenza-Unspecified 03/04/2017, 02/13/2018, 02/19/2020, 04/13/2022   PNEUMOCOCCAL CONJUGATE-20 12/10/2021   Pneumococcal Polysaccharide-23 01/10/2020   Tdap 06/08/2012   Zoster Recombinant(Shingrix ) 03/31/2018, 06/15/2018    Screening Tests Health Maintenance  Topic Date Due   DTaP/Tdap/Td (2 - Td or Tdap) 06/08/2022   COVID-19 Vaccine (1 - 2024-25 season) Never done   Influenza Vaccine  08/14/2024 (Originally 12/16/2023)   Medicare Annual Wellness (AWV)  02/21/2025   Mammogram  06/22/2025   Colonoscopy  04/16/2027   Pneumococcal Vaccine: 50+ Years  Completed   DEXA SCAN  Completed   Hepatitis C Screening  Completed   Zoster Vaccines- Shingrix   Completed   Meningococcal B Vaccine  Aged Out    Health Maintenance Items Addressed: Will complete mammogram and bone density through [facility]. Will get flu vaccine through employer. Will get tetanus at pharmacy.  Additional Screening:  Vision Screening: Recommended annual ophthalmology exams for early detection of glaucoma and other disorders of the eye. Is the patient up to date with their annual eye exam?    Who is the provider or what is the name of the office in which the patient attends annual eye exams?   Dental Screening: Recommended annual dental exams for proper oral hygiene  Community Resource Referral / Chronic Care Management: CRR required this visit?  Yes   CCM required this visit?  No   Plan:    I have personally reviewed and noted the following in the patient's chart:   Medical and social history Use of alcohol, tobacco or illicit drugs  Current medications and supplements including opioid  prescriptions. Patient is not currently taking opioid prescriptions. Functional ability and status Nutritional status Physical activity Advanced directives List of other physicians Hospitalizations, surgeries, and ER visits in previous 12 months Vitals Screenings to include cognitive, depression, and falls Referrals and appointments  In addition, I have reviewed and discussed with patient certain preventive protocols, quality metrics, and best practice recommendations. A written personalized care plan for preventive services as well as general preventive health recommendations were provided to patient.   Lolita Libra, CMA   02/22/2024   After Visit Summary: (MyChart) Due to this being a telephonic visit, the after visit summary with patients personalized plan was offered to patient via MyChart   Notes: Nothing significant to report at this time.

## 2024-03-20 ENCOUNTER — Encounter: Payer: Self-pay | Admitting: Family

## 2024-05-23 ENCOUNTER — Other Ambulatory Visit: Payer: Self-pay | Admitting: Family

## 2024-05-23 DIAGNOSIS — E038 Other specified hypothyroidism: Secondary | ICD-10-CM

## 2024-07-03 ENCOUNTER — Ambulatory Visit: Payer: Medicare HMO | Admitting: Nurse Practitioner

## 2025-02-06 ENCOUNTER — Encounter: Admitting: Family

## 2025-02-27 ENCOUNTER — Ambulatory Visit
